# Patient Record
Sex: Female | Born: 1980 | Race: White | Hispanic: No | Marital: Married | Smoking: Never smoker
Health system: Southern US, Community
[De-identification: ages and names within clinical notes are randomized; demographics above are authoritative.]

## PROBLEM LIST (undated history)

## (undated) ENCOUNTER — Inpatient Hospital Stay (HOSPITAL_COMMUNITY): Payer: Self-pay

## (undated) DIAGNOSIS — E559 Vitamin D deficiency, unspecified: Secondary | ICD-10-CM

## (undated) DIAGNOSIS — J309 Allergic rhinitis, unspecified: Secondary | ICD-10-CM

## (undated) DIAGNOSIS — J069 Acute upper respiratory infection, unspecified: Secondary | ICD-10-CM

## (undated) DIAGNOSIS — R21 Rash and other nonspecific skin eruption: Secondary | ICD-10-CM

## (undated) HISTORY — DX: Rash and other nonspecific skin eruption: R21

## (undated) HISTORY — DX: Vitamin D deficiency, unspecified: E55.9

## (undated) HISTORY — PX: OTHER SURGICAL HISTORY: SHX169

## (undated) HISTORY — PX: ECTOPIC PREGNANCY SURGERY: SHX613

## (undated) HISTORY — DX: Allergic rhinitis, unspecified: J30.9

## (undated) HISTORY — DX: Acute upper respiratory infection, unspecified: J06.9

---

## 2003-12-23 ENCOUNTER — Ambulatory Visit: Payer: Self-pay | Admitting: Internal Medicine

## 2005-12-13 ENCOUNTER — Ambulatory Visit: Payer: Self-pay | Admitting: Internal Medicine

## 2005-12-13 ENCOUNTER — Ambulatory Visit (HOSPITAL_COMMUNITY): Admission: RE | Admit: 2005-12-13 | Discharge: 2005-12-13 | Payer: Self-pay | Admitting: Internal Medicine

## 2007-02-19 ENCOUNTER — Ambulatory Visit: Payer: Self-pay | Admitting: Internal Medicine

## 2007-02-22 ENCOUNTER — Telehealth: Payer: Self-pay | Admitting: Internal Medicine

## 2007-02-26 ENCOUNTER — Ambulatory Visit: Payer: Self-pay | Admitting: Internal Medicine

## 2007-03-26 ENCOUNTER — Ambulatory Visit: Payer: Self-pay | Admitting: Internal Medicine

## 2007-07-22 ENCOUNTER — Telehealth (INDEPENDENT_AMBULATORY_CARE_PROVIDER_SITE_OTHER): Payer: Self-pay | Admitting: *Deleted

## 2007-08-02 ENCOUNTER — Ambulatory Visit (HOSPITAL_COMMUNITY): Admission: RE | Admit: 2007-08-02 | Discharge: 2007-08-02 | Payer: Self-pay | Admitting: Obstetrics and Gynecology

## 2007-08-22 ENCOUNTER — Ambulatory Visit: Payer: Self-pay | Admitting: Internal Medicine

## 2007-08-22 DIAGNOSIS — J069 Acute upper respiratory infection, unspecified: Secondary | ICD-10-CM | POA: Insufficient documentation

## 2007-08-22 HISTORY — DX: Acute upper respiratory infection, unspecified: J06.9

## 2009-09-27 ENCOUNTER — Ambulatory Visit: Payer: Self-pay | Admitting: Internal Medicine

## 2009-09-27 LAB — CONVERTED CEMR LAB
BUN: 15 mg/dL (ref 6–23)
Basophils Absolute: 0 10*3/uL (ref 0.0–0.1)
Bilirubin, Direct: 0.2 mg/dL (ref 0.0–0.3)
Cholesterol: 168 mg/dL (ref 0–200)
Creatinine, Ser: 0.7 mg/dL (ref 0.4–1.2)
Eosinophils Relative: 1.4 % (ref 0.0–5.0)
GFR calc non Af Amer: 101.56 mL/min (ref >60)
Glucose, Bld: 76 mg/dL (ref 70–99)
HCT: 40.3 % (ref 36.0–46.0)
LDL Cholesterol: 116 mg/dL — ABNORMAL HIGH (ref 0–99)
Lymphs Abs: 1.5 10*3/uL (ref 0.7–4.0)
MCV: 90.2 fL (ref 78.0–100.0)
Monocytes Absolute: 0.4 10*3/uL (ref 0.1–1.0)
Monocytes Relative: 8.1 % (ref 3.0–12.0)
Neutrophils Relative %: 60.1 % (ref 43.0–77.0)
Nitrite: NEGATIVE
Platelets: 228 10*3/uL (ref 150.0–400.0)
Potassium: 5.7 meq/L — ABNORMAL HIGH (ref 3.5–5.1)
RDW: 12.7 % (ref 11.5–14.6)
Specific Gravity, Urine: >=1.03 (ref 1.000–1.030)
TSH: 1.13 microintl units/mL (ref 0.35–5.50)
Total Bilirubin: 0.8 mg/dL (ref 0.3–1.2)
Total Protein, Urine: NEGATIVE mg/dL
Triglycerides: 27 mg/dL (ref 0.0–149.0)
VLDL: 5.4 mg/dL (ref 0.0–40.0)
WBC: 4.9 10*3/uL (ref 4.5–10.5)
pH: 5 (ref 5.0–8.0)

## 2009-10-01 ENCOUNTER — Ambulatory Visit: Payer: Self-pay | Admitting: Internal Medicine

## 2009-10-01 DIAGNOSIS — R21 Rash and other nonspecific skin eruption: Secondary | ICD-10-CM

## 2009-10-01 HISTORY — DX: Rash and other nonspecific skin eruption: R21

## 2010-02-15 NOTE — Assessment & Plan Note (Signed)
Summary: PER ROBIN WORK IN-PHYSICAL W/HEP A #2 HEP B # 3--STC   Vital Signs:  Patient profile:   30 year old female Height:      64 inches Weight:      139 pounds BMI:     23.95 O2 Sat:      98 % on Room air Temp:     96.9 degrees F oral Pulse rate:   68 / minute BP sitting:   90 / 60  (left arm) Cuff size:   regular  Vitals Entered By: Zella Ball Ewing CMA Duncan Dull) (October 01, 2009 2:48 PM)  O2 Flow:  Room air  CC: Adult Physical/RE   CC:  Adult Physical/RE.  History of Present Illness: here with husbnad and kids for wellness; overall doing well, no specific complaints except for a small area to the left shoudler itchy slihgt scaly rash for several months without pain or tenderness or worsening;  Pt denies CP, worsening sob, doe, wheezing, orthopnea, pnd, worsening LE edema, palps, dizziness or syncope  Pt denies new neuro symptoms such as headache, facial or extremity weakness  No fever, wt loss, night sweats, loss of appetite or other constitutional symptoms  Did incidendlyt have mild dysuria and freq last wk when her urine specimen was obtained, adn has resolved with the cipro to which she tolerated well.    Preventive Screening-Counseling & Management      Drug Use:  no.    Problems Prior to Update: 1)  Uri  (ICD-465.9)  Medications Prior to Update: 1)  Amoxil 500 Mg  Caps (Amoxicillin) .... 2 By Mouth Two Times A Day 2)  Cipro 500 Mg Tabs (Ciprofloxacin Hcl) .Marland Kitchen.. 1 By Mouth Two Times A Day For 7 Days  Current Medications (verified): 1)  Azithromycin 250 Mg Tabs (Azithromycin) .... 2po Qd For 1 Day, Then 1po Qd For 4days, Then Stop 2)  Cipro 500 Mg Tabs (Ciprofloxacin Hcl) .Marland Kitchen.. 1 By Mouth Two Times A Day For 7 Days 3)  Lotrisone 1-0.05 % Crea (Clotrimazole-Betamethasone) .... 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 neg per pt  Social History: Reviewed history from 02/19/2007 and no changes required. 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         all otherwise negative per pt -    Physical Exam  General:  alert and well-developed.   Head:  Normocephalic and atraumatic without obvious abnormalities. No apparent alopecia or balding. Eyes:  No corneal or conjunctival inflammation noted. EOMI. Perrla. Ears:  External ear exam shows no significant lesions or deformities.  Otoscopic examination reveals clear canals, tympanic membranes are intact bilaterally without bulging, retraction,  inflammation or discharge. Hearing is grossly normal bilaterally. Nose:  no external deformity and no nasal discharge.   Mouth:  good dentition and pharyngeal erythema.   Neck:  supple and no masses.   Lungs:  Normal respiratory effort, chest expands symmetrically. Lungs are clear to auscultation, no crackles or wheezes. Heart:  Normal rate and regular rhythm. S1 and S2 normal without gallop, murmur, click, rub or other  extra sounds. Abdomen:  soft, non-tender, and normal bowel sounds.   Msk:  no joint tenderness and no joint swelling.   Extremities:  no edema, no erythema  Neurologic:  cranial nerves II-XII intact and strength normal in all extremities.   Skin:  color normal.  except for 1 cm area to left trapezoid areea flat scaly rash, nontender Psych:  not anxious appearing and not depressed appearing.     Impression & Recommendations:  Problem # 1:  Preventive Health Care (ICD-V70.0) Assessment New Overall doing well, age appropriate education and counseling updated and referral for appropriate preventive services done unless declined, immunizations up to date or declined, diet counseling done if overweight, urged to quit smoking if smokes , most recent labs reviewed and current ordered if appropriate, ecg reviewed or declined (interpretation per ECG scanned in the EMR if done); information regarding Medicare Prevention requirements given if appropriate; speciality referrals updated as appropriate   Problem # 2:  RASH-NONVESICULAR (ICD-782.1)  Her updated medication list for this problem includes:    Lotrisone 1-0.05 % Crea (Clotrimazole-betamethasone) ..... Use asd two times a day as needed to affected area  treat as above, f/u any worsening signs or symptoms   Complete Medication List: 1)  Azithromycin 250 Mg Tabs (Azithromycin) .... 2po qd for 1 day, then 1po qd for 4days, then stop 2)  Cipro 500 Mg Tabs (Ciprofloxacin hcl) .Marland Kitchen.. 1 by mouth two times a day for 7 days 3)  Lotrisone 1-0.05 % Crea (Clotrimazole-betamethasone) .... Use asd two times a day as needed to affected area  Other Orders: Admin 1st Vaccine (16109) Flu Vaccine 34yrs + (60454) TwinRix 1ml ( Hep A&B Adult dose) (09811) Admin of Any Addtl Vaccine (91478)  Patient Instructions: 1)  you had the flu shot today 2)  you had the HepA/HepB shot today 9called twinrx) 3)  Please take all new medications as prescribed  - the zpack  antibx if you are sick in [place], and the cream for the small rash on the shoulder 4)  Continue all previous medications as before this visit  - to finish the antibiotic for the bladder infection 5)  please follow lower cholesterol diet, get regular excercise 6)  Please schedule a follow-up appointment in 1 year or as needed Prescriptions: LOTRISONE 1-0.05 % CREA (CLOTRIMAZOLE-BETAMETHASONE) use asd two times a day as needed to affected area  #1 x 1   Entered and Authorized by:   Corwin Levins MD   Signed by:   Corwin Levins MD on 10/01/2009   Method used:   Print then Give to Patient   RxID:   [id number] AZITHROMYCIN 250 MG TABS (AZITHROMYCIN) 2po qd for 1 day, then 1po qd for 4days, then stop  #6 x 1   Entered and Authorized by:   Corwin Levins MD   Signed by:   Corwin Levins MD on 10/01/2009   Method used:   Print then Give to Patient   RxID:   [id number]     Flu Vaccine Consent Questions     Do  you have a history of severe allergic reactions to this vaccine? no    Any prior history of allergic reactions to egg and/or gelatin? no    Do you have a sensitivity to the preservative Thimersol? no    Do you have a past history of Guillan-Barre Syndrome? no    Do you currently have an acute febrile illness? no    Have you ever had a severe reaction to latex? no    Vaccine information given and explained to patient? yes    Are you currently pregnant? no    Lot Number:[id number]   Exp Date:07/16/2010   Site Given  Left Deltoid IMlu   Immunizations Administered:  TwinRix # 1:    Vaccine Type: TwinRix    Site: right deltoid    Mfr: GlaxoSmithKline    Dose: 0.5 ml    Route: IM    Given by: Zella Ball Ewing CMA (AAMA)    Exp. Date: 10/23/2011    Lot #: [id number]    VIS given: 10/04/06 version given October 01, 2009.   Immunizations Administered:  TwinRix # 1:    Vaccine Type: TwinRix    Site: right deltoid    Mfr: GlaxoSmithKline    Dose: 0.5 ml    Route: IM    Given  by: Zella Ball Ewing CMA (AAMA)    Exp. Date: 10/23/2011    Lot #: [id number]    VIS given: 10/04/06 version given October 01, 2009.

## 2010-05-23 ENCOUNTER — Encounter: Payer: Self-pay | Admitting: Internal Medicine

## 2010-05-23 DIAGNOSIS — Z0000 Encounter for general adult medical examination without abnormal findings: Secondary | ICD-10-CM | POA: Insufficient documentation

## 2010-05-24 ENCOUNTER — Ambulatory Visit (INDEPENDENT_AMBULATORY_CARE_PROVIDER_SITE_OTHER): Payer: BC Managed Care – PPO | Admitting: Internal Medicine

## 2010-05-24 ENCOUNTER — Encounter: Payer: Self-pay | Admitting: Internal Medicine

## 2010-05-24 ENCOUNTER — Telehealth: Payer: Self-pay | Admitting: Internal Medicine

## 2010-05-24 DIAGNOSIS — R062 Wheezing: Secondary | ICD-10-CM

## 2010-05-24 DIAGNOSIS — J209 Acute bronchitis, unspecified: Secondary | ICD-10-CM

## 2010-05-24 DIAGNOSIS — J309 Allergic rhinitis, unspecified: Secondary | ICD-10-CM | POA: Insufficient documentation

## 2010-05-24 MED ORDER — CEPHALEXIN 500 MG PO CAPS: 500.0000 mg | ORAL_CAPSULE | Freq: Four times a day (QID) | ORAL | Status: AC

## 2010-05-24 MED ORDER — PREDNISONE 10 MG PO TABS: 10.0000 mg | ORAL_TABLET | Freq: Every day | ORAL | Status: AC

## 2010-05-24 MED ORDER — METHYLPREDNISOLONE ACETATE 80 MG/ML IJ SUSP
120.0000 mg | Freq: Once | INTRAMUSCULAR | Status: AC
  Administered 2010-05-24: 120 mg via INTRAMUSCULAR

## 2010-05-24 MED ORDER — FLUTICASONE PROPIONATE 50 MCG/ACT NA SUSP: 2.0000 | Freq: Every day | NASAL | Status: DC

## 2010-05-24 MED ORDER — HYDROCODONE-HOMATROPINE 5-1.5 MG/5ML PO SYRP: 5.0000 mL | ORAL_SOLUTION | Freq: Four times a day (QID) | ORAL | Status: AC | PRN

## 2010-05-24 NOTE — Telephone Encounter (Signed)
Pt's husband, Urbano Heir, returned called.  States pt has lost her voice and cannot talk right now.  States pt was seen by Dr. [name] today for breathing problems.  This has been going on for a few weeks.  Pt has a pending appt with [facility] for 07/04/10 for allergy consult but pt's husband thinks this is too far away for pt to wait if her breathing problems are coming from allergies.  He would like her to be seen sooner.  Dr. Maple Hudson, pls advise if pt can be worked in sooner.  Thanks!

## 2010-05-24 NOTE — Assessment & Plan Note (Signed)
Mild, likely due to above, does not ned inhaler but will tx with depomedrol IM today, and prednisone low dose for home

## 2010-05-24 NOTE — Telephone Encounter (Signed)
Pt has pending allergy consult with [facility] for 07/04/10 - no openings before then.  LMOMTCB to see what problems pt is having then will send message to [facility].

## 2010-05-24 NOTE — Assessment & Plan Note (Signed)
uncontroled for 1-2 mo prior to onset symtpoms,  Will add flonase to her allegra D OTC, and refer to allergy per pt request

## 2010-05-24 NOTE — Assessment & Plan Note (Signed)
Mild, for antibx  Course, cough med prn,  to f/u any worsening symptoms or concerns

## 2010-05-24 NOTE — Patient Instructions (Signed)
You had the steroid shot today Take all new medications as prescribed Continue all other medications as before You will be contacted regarding the referral for: allergy

## 2010-05-25 NOTE — Telephone Encounter (Signed)
Spoke w/ Urbano Heir pt husband and he is aware of apt date, time, location. Also aware to arrive 15 mins early to fill out paper work  He is also aware pt to bring insurance card and updated med list or all pt medications. Nothing further was needed. Pt coming in 5/23 at 11:15

## 2010-05-25 NOTE — Telephone Encounter (Signed)
We can work patient in on Wednesday 06-08-10 at 11am for 1115am appt-will need to bring insurance card,copays, and updated medication list.

## 2010-05-29 ENCOUNTER — Encounter: Payer: Self-pay | Admitting: Internal Medicine

## 2010-05-29 NOTE — Progress Notes (Signed)
  Subjective:    Patient ID: Regina Fox, female    DOB: [birth year], 30 y.o.   MRN: [id number]  HPI Here with acute onset mild to mod 2-3 days ST, HA, general weakness and malaise, with prod cough greenish sputum, but Pt denies chest pain,  orthopnea, PND, increased LE swelling, palpitations, dizziness or syncope, but has had mild wheezing,sob as well. Does have several wks ongoing nasal allergy symptoms with clear congestion, itch and sneeze, without fever, pain, ST, cough or wheezing.  Pt denies new neurological symptoms such as new headache, or facial or extremity weakness or numbness  Pt denies polydipsia, polyuria. Past Medical History  Diagnosis Date  . URI 08/22/2007  . RASH-NONVESICULAR 10/01/2009   No past surgical history on file.  reports that she has never smoked. She does not have any smokeless tobacco history on file. She reports that she does not drink alcohol or use illicit drugs. family history is not on file. No Known Allergies Current Outpatient Prescriptions on File Prior to Visit  Medication Sig Dispense Refill  . azithromycin (ZITHROMAX) 250 MG tablet Take 2 tablets by mouth on day 1, followed by 1 tablet by mouth daily for 4 days. 2 by mouth for 1 day, then 1 by mouth for 4 days, then stop       . ciprofloxacin (CIPRO) 500 MG tablet Take 500 mg by mouth 2 (two) times daily. For 7 days then stop.       . clotrimazole-betamethasone (LOTRISONE) cream Apply topically 2 (two) times daily.         Review of Systems All otherwise neg per pt     Objective:   Physical Exam BP 98/60  Pulse 84  Temp(Src) 97.9 F (36.6 C) (Oral)  Ht 5\' 2"  (1.575 m)  Wt 138 lb 12 oz (62.937 kg)  BMI 25.38 kg/m2  SpO2 96%  LMP 04/30/2010 Physical Exam  VS noted Constitutional: Pt appears well-developed and well-nourished.  HENT: Head: Normocephalic.  Right Ear: External ear normal.  Left Ear: External ear normal.  Bilat tm's mild erythema.  Sinus nontender.  Pharynx mild erythema Eyes:  Conjunctivae and EOM are normal. Pupils are equal, round, and reactive to light.  Neck: Normal range of motion. Neck supple.  Cardiovascular: Normal rate and regular rhythm.   Pulmonary/Chest: Effort normal and breath sounds decreased bilat with mild wheeze.  Neurological: Pt is alert. No cranial nerve deficit.  Skin: Skin is warm. No erythema.  Psychiatric: Pt behavior is normal. Thought content normal.        Assessment & Plan:

## 2010-06-08 ENCOUNTER — Institutional Professional Consult (permissible substitution): Payer: BC Managed Care – PPO | Admitting: Internal Medicine

## 2010-07-04 ENCOUNTER — Institutional Professional Consult (permissible substitution): Payer: BC Managed Care – PPO | Admitting: Internal Medicine

## 2010-07-18 ENCOUNTER — Ambulatory Visit (INDEPENDENT_AMBULATORY_CARE_PROVIDER_SITE_OTHER): Payer: BC Managed Care – PPO | Admitting: Internal Medicine

## 2010-07-18 ENCOUNTER — Other Ambulatory Visit: Payer: BC Managed Care – PPO

## 2010-07-18 ENCOUNTER — Encounter: Payer: Self-pay | Admitting: *Deleted

## 2010-07-18 ENCOUNTER — Telehealth: Payer: Self-pay | Admitting: *Deleted

## 2010-07-18 VITALS — BP 110/68 | HR 84 | Temp 97.8°F | Resp 14 | Wt 140.1 lb

## 2010-07-18 DIAGNOSIS — R51 Headache: Secondary | ICD-10-CM | POA: Insufficient documentation

## 2010-07-18 DIAGNOSIS — N643 Galactorrhea not associated with childbirth: Secondary | ICD-10-CM | POA: Insufficient documentation

## 2010-07-18 DIAGNOSIS — R519 Headache, unspecified: Secondary | ICD-10-CM | POA: Insufficient documentation

## 2010-07-18 DIAGNOSIS — J309 Allergic rhinitis, unspecified: Secondary | ICD-10-CM

## 2010-07-18 DIAGNOSIS — J069 Acute upper respiratory infection, unspecified: Secondary | ICD-10-CM

## 2010-07-18 HISTORY — DX: Allergic rhinitis, unspecified: J30.9

## 2010-07-18 MED ORDER — ISOMETHEPTENE-APAP-DICHLORAL 65-325-100 MG PO CAPS: 1.0000 | ORAL_CAPSULE | ORAL | Status: DC | PRN

## 2010-07-18 MED ORDER — AZITHROMYCIN 250 MG PO TABS: ORAL_TABLET | ORAL | Status: AC

## 2010-07-18 MED ORDER — NAPROXEN 500 MG PO TABS: 500.0000 mg | ORAL_TABLET | Freq: Two times a day (BID) | ORAL | Status: DC

## 2010-07-18 NOTE — Telephone Encounter (Signed)
Same Day Abstraction. 

## 2010-07-18 NOTE — Progress Notes (Signed)
  Subjective:    Patient ID: Regina Fox, female    DOB: Apr 13, 1980, 30 y.o.   MRN: [id number]  HPI  Here with c/o daily HA for 4 wks, mild to mod, pressure liek, sometimes throbbing, without blurred vision, n/v, dizziness and Pt denies new neurological symptoms such as facial or extremity weakness or numbness.  Ibuprofen no help. Stress makes worse, has 2 small children at home, considering having a third.  Husband has taken time off work for last 4 days, but HA's persist.  No pain at night, but seems to wake up with HA mild, then worse by the end of the day. No hypersomia, trauma, and Denies worsening depressive symptoms, suicidal ideation, or panic.  + fatigue . Did also see GYN recently with galactorrhea  - per husband told to tx with ibuprofen and no other plan for eval or tx.  PT speaks some english but primary langauge is arabic/husband translates.  Originally from [place]. Does have several wks ongoing nasal allergy symptoms with clear congestion, itch and sneeze, without fever, pain, ST, cough or wheezing.  Also incidentally with mild scratchy throat in the past 2 days now better Past Medical History  Diagnosis Date  . URI 08/22/2007  . RASH-NONVESICULAR 10/01/2009  . Allergic rhinitis, cause unspecified 07/18/2010   Past Surgical History  Procedure Date  . Denied surgical hx     reports that she has never smoked. She does not have any smokeless tobacco history on file. She reports that she does not drink alcohol or use illicit drugs. family history is not on file. No Known Allergies No current outpatient prescriptions on file prior to visit.   Review of Systems Review of Systems  Constitutional: Negative for diaphoresis and unexpected weight change.  HENT: Negative for drooling and tinnitus.   Eyes: Negative for photophobia and visual disturbance.  Respiratory: Negative for choking and stridor.   Musculoskeletal: Negative for gait problem.  Skin: Negative for color change and wound.    Neurological: Negative for tremors and numbness.  Psychiatric/Behavioral: Negative for decreased concentration. The patient is not hyperactive.       Objective:   Physical Exam BP 110/68  Pulse 84  Temp(Src) 97.8 F (36.6 C) (Oral)  Resp 14  Wt 140 lb 2 oz (63.56 kg)  SpO2 98%  LMP 05/24/2010 Physical Exam  VS noted Constitutional: Pt appears well-developed and well-nourished.  HENT: Head: Normocephalic.  Right Ear: External ear normal.  Left Ear: External ear normal.  Bilat tm's mild erythema.  Sinus nontender.  Pharynx mild erythema Eyes: Conjunctivae and EOM are normal. Pupils are equal, round, and reactive to light.  Neck: Normal range of motion. Neck supple.  Cardiovascular: Normal rate and regular rhythm.   Pulmonary/Chest: Effort normal and breath sounds normal.  Abd:  Soft, NT, non-distended, + BS Neurological: Pt is alert. No cranial nerve deficit. motor/sens/dtr intact Skin: Skin is warm. No erythema.  Psychiatric: Pt behavior is normal. Thought content normal. 1+ nervous, not depressed appearing        Assessment & Plan:

## 2010-07-18 NOTE — Patient Instructions (Addendum)
Please stop the ibuprofen Start the naproxen as prescribed for pain as needed only You can also try OTC exedrin migraine for milder HA's Take all new medications as prescribed - the generic for Midrin for more severe HA's, and the antibiotic You can also take Delsym OTC for cough, and/or Mucinex (or it's generic off brand) for congestion You will be contacted regarding the referral for: MRI for the head, and [facility] Please go to LAB in the Basement for the blood and/or urine tests to be done today - the prolactin level Please call the phone number [phone] (the [employer]Tree System) for results of testing in 2-3 days;  When calling, simply dial the number, and when prompted enter the MRN number above (the Medical Record Number) and the # key, then the message should start.

## 2010-07-19 NOTE — Progress Notes (Signed)
Quick Note:  Voice message left on PhoneTree system - lab is negative, normal or otherwise stable, pt to continue same tx ______ 

## 2010-07-20 ENCOUNTER — Encounter: Payer: Self-pay | Admitting: Internal Medicine

## 2010-07-20 NOTE — Assessment & Plan Note (Signed)
Incidental, for mucinex otc prn, zpack x 1,  to f/u any worsening symptoms or concerns

## 2010-07-20 NOTE — Assessment & Plan Note (Signed)
Mild to mod, for allegra prn, declines further flonase,   to f/u any worsening symptoms or concerns, consdier allergy referral

## 2010-07-20 NOTE — Assessment & Plan Note (Signed)
Suspect mixed HA  - tension/migraine;  For naproxen prn/excedrin migraine for milder HA, gave midrin for more severe, consider imitrex, and with galactorrhea will need Head MRI, as well as HA wellness referral for dialy HA

## 2010-07-20 NOTE — Assessment & Plan Note (Signed)
Mild persistent - for prolactin level, and Head MRI as above;  Consider endo referral

## 2010-08-01 ENCOUNTER — Other Ambulatory Visit: Payer: Self-pay | Admitting: Internal Medicine

## 2010-08-01 DIAGNOSIS — R51 Headache: Secondary | ICD-10-CM

## 2010-08-02 ENCOUNTER — Ambulatory Visit
Admission: RE | Admit: 2010-08-02 | Discharge: 2010-08-02 | Disposition: A | Discharge status: Home / Self Care | Payer: BC Managed Care – PPO | Source: Ambulatory Visit | Attending: Internal Medicine | Admitting: Internal Medicine

## 2010-08-02 MED ORDER — GADOBENATE DIMEGLUMINE 529 MG/ML IV SOLN
7.0000 mL | Freq: Once | INTRAVENOUS | Status: AC | PRN
  Administered 2010-08-02: 7 mL via INTRAVENOUS

## 2010-08-03 NOTE — Progress Notes (Signed)
Quick Note:  Voice message left on PhoneTree system - lab is negative, normal or otherwise stable, pt to continue same tx ______ 

## 2010-08-04 ENCOUNTER — Telehealth: Payer: Self-pay | Admitting: *Deleted

## 2010-08-04 NOTE — Telephone Encounter (Signed)
I HA's persist, I can refer to [facility] - is this OK?

## 2010-08-04 NOTE — Telephone Encounter (Signed)
Pt informed/MRI. Inquiring as to next step to determine etiology of headaches.

## 2010-08-05 NOTE — Telephone Encounter (Signed)
Called pt husband no answer LMOM RTC.Marland KitchenMarland Kitchen7/20/12@3 :10pm/LMB

## 2010-08-08 NOTE — Telephone Encounter (Signed)
Patient and husband are informed and have referral to [facility] wellness ctr.

## 2010-10-05 ENCOUNTER — Telehealth: Payer: Self-pay

## 2010-10-05 DIAGNOSIS — Z0000 Encounter for general adult medical examination without abnormal findings: Secondary | ICD-10-CM

## 2010-10-05 NOTE — Telephone Encounter (Signed)
Put order in for physical labs. 

## 2010-11-07 ENCOUNTER — Other Ambulatory Visit (INDEPENDENT_AMBULATORY_CARE_PROVIDER_SITE_OTHER): Payer: BC Managed Care – PPO

## 2010-11-07 DIAGNOSIS — Z0000 Encounter for general adult medical examination without abnormal findings: Secondary | ICD-10-CM

## 2010-11-07 LAB — BASIC METABOLIC PANEL
Calcium: 8.9 mg/dL (ref 8.4–10.5)
GFR: 121.63 mL/min (ref >60.00)
Glucose, Bld: 86 mg/dL (ref 70–99)
Sodium: 139 mEq/L (ref 135–145)

## 2010-11-07 LAB — TSH: TSH: 1.22 u[IU]/mL (ref 0.35–5.50)

## 2010-11-07 LAB — CBC WITH DIFFERENTIAL/PLATELET
Eosinophils Absolute: 0 10*3/uL (ref 0.0–0.7)
Eosinophils Relative: 0.6 % (ref 0.0–5.0)
HCT: 40 % (ref 36.0–46.0)
Lymphs Abs: 1.5 10*3/uL (ref 0.7–4.0)
MCHC: 34.1 g/dL (ref 30.0–36.0)
MCV: 89.3 fl (ref 78.0–100.0)
Monocytes Absolute: 0.4 10*3/uL (ref 0.1–1.0)
Neutrophils Relative %: 64.3 % (ref 43.0–77.0)
Platelets: 208 10*3/uL (ref 150.0–400.0)
RDW: 12.4 % (ref 11.5–14.6)

## 2010-11-07 LAB — URINALYSIS, ROUTINE W REFLEX MICROSCOPIC
Specific Gravity, Urine: 1.025 (ref 1.000–1.030)
Urine Glucose: NEGATIVE
Urobilinogen, UA: 0.2 (ref 0.0–1.0)
pH: 6 (ref 5.0–8.0)

## 2010-11-07 LAB — LIPID PANEL
LDL Cholesterol: 93 mg/dL (ref 0–99)
VLDL: 5 mg/dL (ref 0.0–40.0)

## 2010-11-07 LAB — HEPATIC FUNCTION PANEL
Albumin: 4.3 g/dL (ref 3.5–5.2)
Alkaline Phosphatase: 47 U/L (ref 39–117)
Total Bilirubin: 0.9 mg/dL (ref 0.3–1.2)

## 2010-11-09 ENCOUNTER — Encounter: Payer: Self-pay | Admitting: Internal Medicine

## 2010-11-09 ENCOUNTER — Ambulatory Visit (INDEPENDENT_AMBULATORY_CARE_PROVIDER_SITE_OTHER): Payer: BC Managed Care – PPO | Admitting: Internal Medicine

## 2010-11-09 VITALS — BP 98/64 | HR 70 | Temp 98.0°F | Ht 61.0 in | Wt 146.5 lb

## 2010-11-09 DIAGNOSIS — Z0000 Encounter for general adult medical examination without abnormal findings: Secondary | ICD-10-CM

## 2010-11-09 NOTE — Patient Instructions (Signed)
Continue all other medications as before - the OTC we discussed Please return in 1 year for your yearly visit, or sooner if needed, with Lab testing done 3-5 days before

## 2010-11-13 ENCOUNTER — Encounter: Payer: Self-pay | Admitting: Internal Medicine

## 2010-11-13 NOTE — Progress Notes (Signed)
Subjective:    Patient ID: Regina Fox, female    DOB: [birth year], 30 y.o.   MRN: [id number]  HPI  Here for wellness and f/u;  Overall doing ok;  Pt denies CP, worsening SOB, DOE, wheezing, orthopnea, PND, worsening LE edema, palpitations, dizziness or syncope.  Pt denies neurological change such as new Headache, facial or extremity weakness.  Pt denies polydipsia, polyuria, or low sugar symptoms. Pt states overall good compliance with treatment and medications, good tolerability, and trying to follow lower cholesterol diet.  Pt denies worsening depressive symptoms, suicidal ideation or panic. No fever, wt loss, night sweats, loss of appetite, or other constitutional symptoms.  Pt states good ability with ADL's, low fall risk, home safety reviewed and adequate, no significant changes in hearing or vision, and occasionally active with exercise.  No acute complaints today Past Medical History  Diagnosis Date  . URI 08/22/2007  . RASH-NONVESICULAR 10/01/2009  . Allergic rhinitis, cause unspecified 07/18/2010   Past Surgical History  Procedure Date  . Denied surgical hx     reports that she has never smoked. She does not have any smokeless tobacco history on file. She reports that she does not drink alcohol or use illicit drugs. family history is not on file. No Known Allergies Current Outpatient Prescriptions on File Prior to Visit  Medication Sig Dispense Refill  . isometheptene-acetaminophen-dichloralphenazone (MIDRIN) 65-325-100 MG capsule Take 1 capsule by mouth every 4 (four) hours as needed.  30 capsule  2  . naproxen (NAPROSYN) 500 MG tablet Take 1 tablet (500 mg total) by mouth 2 (two) times daily with a meal.  60 tablet  2   Review of Systems Review of Systems  Constitutional: Negative for diaphoresis, activity change, appetite change and unexpected weight change.  HENT: Negative for hearing loss, ear pain, facial swelling, mouth sores and neck stiffness.   Eyes: Negative for pain,  redness and visual disturbance.  Respiratory: Negative for shortness of breath and wheezing.   Cardiovascular: Negative for chest pain and palpitations.  Gastrointestinal: Negative for diarrhea, blood in stool, abdominal distention and rectal pain.  Genitourinary: Negative for hematuria, flank pain and decreased urine volume.  Musculoskeletal: Negative for myalgias and joint swelling.  Skin: Negative for color change and wound.  Neurological: Negative for syncope and numbness.  Hematological: Negative for adenopathy.  Psychiatric/Behavioral: Negative for hallucinations, self-injury, decreased concentration and agitation.      Objective:   Physical Exam BP 98/64  Pulse 70  Temp(Src) 98 F (36.7 C) (Oral)  Ht 5\' 1"  (1.549 m)  Wt 146 lb 8 oz (66.452 kg)  BMI 27.68 kg/m2  SpO2 95%  LMP 10/13/2010 Physical Exam  VS noted Constitutional: Pt is oriented to person, place, and time. Appears well-developed and well-nourished.  HENT:  Head: Normocephalic and atraumatic.  Right Ear: External ear normal.  Left Ear: External ear normal.  Nose: Nose normal.  Mouth/Throat: Oropharynx is clear and moist.  Eyes: Conjunctivae and EOM are normal. Pupils are equal, round, and reactive to light.  Neck: Normal range of motion. Neck supple. No JVD present. No tracheal deviation present.  Cardiovascular: Normal rate, regular rhythm, normal heart sounds and intact distal pulses.   Pulmonary/Chest: Effort normal and breath sounds normal.  Abdominal: Soft. Bowel sounds are normal. There is no tenderness.  Musculoskeletal: Normal range of motion. Exhibits no edema.  Lymphadenopathy:  Has no cervical adenopathy.  Neurological: Pt is alert and oriented to person, place, and time. Pt has normal reflexes. No cranial  nerve deficit.  Skin: Skin is warm and dry. No rash noted.  Psychiatric:  Has  normal mood and affect. Behavior is normal.     Assessment & Plan:

## 2010-11-13 NOTE — Assessment & Plan Note (Signed)

## 2010-11-17 ENCOUNTER — Ambulatory Visit (INDEPENDENT_AMBULATORY_CARE_PROVIDER_SITE_OTHER): Payer: BC Managed Care – PPO | Admitting: Internal Medicine

## 2010-11-17 ENCOUNTER — Encounter: Payer: Self-pay | Admitting: Internal Medicine

## 2010-11-17 ENCOUNTER — Ambulatory Visit (INDEPENDENT_AMBULATORY_CARE_PROVIDER_SITE_OTHER)
Admission: RE | Admit: 2010-11-17 | Discharge: 2010-11-17 | Disposition: A | Discharge status: Home / Self Care | Payer: BC Managed Care – PPO | Source: Ambulatory Visit | Attending: Internal Medicine | Admitting: Internal Medicine

## 2010-11-17 VITALS — BP 114/68 | HR 72 | Temp 98.2°F | Resp 16 | Wt 150.0 lb

## 2010-11-17 DIAGNOSIS — S6992XA Unspecified injury of left wrist, hand and finger(s), initial encounter: Secondary | ICD-10-CM

## 2010-11-17 DIAGNOSIS — S6990XA Unspecified injury of unspecified wrist, hand and finger(s), initial encounter: Secondary | ICD-10-CM

## 2010-11-17 NOTE — Progress Notes (Signed)
  Subjective:    Patient ID: Regina Fox, female    DOB: [birth year], 30 y.o.   MRN: [id number]  HPI  New to me she tells me that she fell last night and hit her left hand on the ground, today she complains of pain/swelling/bruising in the left hand but she still has good ROM with no paresthesias, she does not want any meds for pain.  Review of Systems  Constitutional: Negative.   HENT: Negative.   Eyes: Negative.   Respiratory: Negative.   Cardiovascular: Negative.   Gastrointestinal: Negative.   Genitourinary: Negative.   Musculoskeletal: Positive for arthralgias (left hand). Negative for myalgias, back pain, joint swelling and gait problem.  Skin: Negative for color change, pallor, rash and wound.  Neurological: Negative for dizziness, tremors, seizures, syncope, facial asymmetry, speech difficulty, weakness, light-headedness, numbness and headaches.  Hematological: Negative.   Psychiatric/Behavioral: Negative.        Objective:   Physical Exam  Musculoskeletal: Normal range of motion.       Left hand: She exhibits tenderness and swelling. She exhibits normal range of motion, no bony tenderness, normal capillary refill, no deformity and no laceration. normal sensation noted. Normal strength noted.       Hands:         Assessment & Plan:

## 2010-11-17 NOTE — Assessment & Plan Note (Signed)
I will xray her hand to look for fracture, she will use otc meds as needed for pain, I gave her pt ed material about the treatment of hand injuries

## 2010-11-17 NOTE — Patient Instructions (Signed)
Hand Injuries Minor fractures, sprains, bruises and burns of the hand are often managed in much the same way:  Elevation of your hand above the level of your heart for a few days after the injury, until the pain and swelling improve.   The use of hand dressings and splints to reduce motion, relieve painand prevent reinjury. The dressing and splint should not be removed without the caregiver's approval.   Application of ice packs for 20 to 30 minutes every few hours for 2 to 3 days to reduce pain and swelling due to fractures, sprains, and deep bruises.   The use of medicine to reduce pain and inflammation.  Early motion exercises are sometimes needed to reduce joint stiffness after a hand injury. However, your hand should not be used for any activities that increase pain. Document Released: 02/10/2004 Document Revised: 09/14/2010 Document Reviewed: 04/03/2008 [facility] Patient Information 2012 [place], [place].

## 2010-12-01 ENCOUNTER — Ambulatory Visit: Payer: BC Managed Care – PPO | Admitting: Internal Medicine

## 2011-02-21 ENCOUNTER — Encounter: Payer: Self-pay | Admitting: Internal Medicine

## 2011-02-21 ENCOUNTER — Ambulatory Visit (INDEPENDENT_AMBULATORY_CARE_PROVIDER_SITE_OTHER): Payer: BC Managed Care – PPO | Admitting: Internal Medicine

## 2011-02-21 VITALS — BP 102/62 | HR 76 | Temp 97.6°F | Ht 62.0 in | Wt 148.0 lb

## 2011-02-21 DIAGNOSIS — J029 Acute pharyngitis, unspecified: Secondary | ICD-10-CM | POA: Insufficient documentation

## 2011-02-21 MED ORDER — AZITHROMYCIN 250 MG PO TABS: ORAL_TABLET | ORAL | Status: AC

## 2011-02-21 NOTE — Assessment & Plan Note (Signed)
Mild to mod, for antibx course,  to f/u any worsening symptoms or concerns, cant r/o strep

## 2011-02-21 NOTE — Patient Instructions (Signed)
Take all new medications as prescribed Continue all other medications as before You can also take Delsym OTC for cough, and/or Mucinex (or it's generic off brand) for congestion  

## 2011-02-21 NOTE — Progress Notes (Signed)
  Subjective:    Patient ID: Regina Fox, female    DOB: [birth year], 31 y.o.   MRN: [id number]  HPI here to c/o acute onset ST for 3 wks, gradually worse in the past 2 wks since her son dx and tx for strep;  Also with HA, fatigue, sorness to the anterior neck under the chin, slight cough, and left ear aching.,  No chills and Pt denies chest pain, increased sob or doe, wheezing, orthopnea, PND, increased LE swelling, palpitations, dizziness or syncope.  Not pregnant Past Medical History  Diagnosis Date  . URI 08/22/2007  . RASH-NONVESICULAR 10/01/2009  . Allergic rhinitis, cause unspecified 07/18/2010   Past Surgical History  Procedure Date  . Denied surgical hx     reports that she has never smoked. She does not have any smokeless tobacco history on file. She reports that she does not drink alcohol or use illicit drugs. family history is not on file. No Known Allergies Current Outpatient Prescriptions on File Prior to Visit  Medication Sig Dispense Refill  . isometheptene-acetaminophen-dichloralphenazone (MIDRIN) 65-325-100 MG capsule Take 1 capsule by mouth every 4 (four) hours as needed.  30 capsule  2   Review of Systems Review of Systems  Constitutional: Negative for diaphoresis and unexpected weight change.  HENT: Negative for drooling and tinnitus.   Eyes: Negative for photophobia and visual disturbance.  Respiratory: Negative for choking and stridor.   Gastrointestinal: Negative for vomiting and blood in stool.  Genitourinary: Negative for hematuria and decreased urine volume.     Objective:   Physical Exam BP 102/62  Pulse 76  Temp(Src) 97.6 F (36.4 C) (Oral)  Ht 5\' 2"  (1.575 m)  Wt 148 lb (67.132 kg)  BMI 27.07 kg/m2  SpO2 98% Physical Exam  VS noted, mild ill Constitutional: Pt appears well-developed and well-nourished.  HENT: Head: Normocephalic.  Right Ear: External ear normal.  Left Ear: External ear mild erythema Bilat tm's mild erythema.  Sinus tender bilat.   Pharynx mild erythema with exudate Eyes: Conjunctivae and EOM are normal. Pupils are equal, round, and reactive to light.  Neck: Normal range of motion. Neck supple. mild tender bilat submandib LA noted Cardiovascular: Normal rate and regular rhythm.   Pulmonary/Chest: Effort normal and breath sounds normal.  Psychiatric: Pt behavior is normal. Thought content normal.     Assessment & Plan:

## 2011-04-27 ENCOUNTER — Other Ambulatory Visit: Payer: Self-pay | Admitting: Obstetrics and Gynecology

## 2011-04-27 ENCOUNTER — Other Ambulatory Visit (HOSPITAL_COMMUNITY)
Admission: RE | Admit: 2011-04-27 | Discharge: 2011-04-27 | Disposition: A | Discharge status: Home / Self Care | Payer: BC Managed Care – PPO | Source: Ambulatory Visit | Attending: Obstetrics and Gynecology | Admitting: Obstetrics and Gynecology

## 2011-04-27 DIAGNOSIS — Z124 Encounter for screening for malignant neoplasm of cervix: Secondary | ICD-10-CM | POA: Insufficient documentation

## 2011-04-28 ENCOUNTER — Other Ambulatory Visit: Payer: Self-pay | Admitting: Obstetrics and Gynecology

## 2011-12-11 ENCOUNTER — Encounter: Payer: BC Managed Care – PPO | Admitting: Internal Medicine

## 2012-02-12 ENCOUNTER — Other Ambulatory Visit (INDEPENDENT_AMBULATORY_CARE_PROVIDER_SITE_OTHER): Payer: BC Managed Care – PPO

## 2012-02-12 ENCOUNTER — Telehealth: Payer: Self-pay

## 2012-02-12 ENCOUNTER — Other Ambulatory Visit: Payer: Self-pay | Admitting: Internal Medicine

## 2012-02-12 DIAGNOSIS — E559 Vitamin D deficiency, unspecified: Secondary | ICD-10-CM

## 2012-02-12 DIAGNOSIS — Z0000 Encounter for general adult medical examination without abnormal findings: Secondary | ICD-10-CM

## 2012-02-12 LAB — URINALYSIS, ROUTINE W REFLEX MICROSCOPIC
Bilirubin Urine: NEGATIVE
Hgb urine dipstick: NEGATIVE
Ketones, ur: NEGATIVE
Leukocytes, UA: NEGATIVE
Specific Gravity, Urine: >=1.03 (ref 1.000–1.030)
Urobilinogen, UA: 0.2 (ref 0.0–1.0)

## 2012-02-12 LAB — CBC WITH DIFFERENTIAL/PLATELET
Basophils Absolute: 0 10*3/uL (ref 0.0–0.1)
Eosinophils Relative: 1 % (ref 0.0–5.0)
Lymphocytes Relative: 31.2 % (ref 12.0–46.0)
Lymphs Abs: 1.5 10*3/uL (ref 0.7–4.0)
Monocytes Relative: 7.8 % (ref 3.0–12.0)
Neutrophils Relative %: 59.3 % (ref 43.0–77.0)
Platelets: 209 10*3/uL (ref 150.0–400.0)
RDW: 12.2 % (ref 11.5–14.6)
WBC: 4.9 10*3/uL (ref 4.5–10.5)

## 2012-02-12 LAB — LIPID PANEL
HDL: 55.2 mg/dL (ref >39.00)
LDL Cholesterol: 111 mg/dL — ABNORMAL HIGH (ref 0–99)
Total CHOL/HDL Ratio: 3
VLDL: 6 mg/dL (ref 0.0–40.0)

## 2012-02-12 LAB — HEPATIC FUNCTION PANEL
ALT: 22 U/L (ref 0–35)
Total Bilirubin: 1.2 mg/dL (ref 0.3–1.2)

## 2012-02-12 LAB — BASIC METABOLIC PANEL
BUN: 14 mg/dL (ref 6–23)
Chloride: 103 mEq/L (ref 96–112)
Creatinine, Ser: 0.7 mg/dL (ref 0.4–1.2)

## 2012-02-12 LAB — TSH: TSH: 1.21 u[IU]/mL (ref 0.35–5.50)

## 2012-02-12 NOTE — Telephone Encounter (Signed)
Put lab order in. 

## 2012-02-13 LAB — VITAMIN D 25 HYDROXY (VIT D DEFICIENCY, FRACTURES): Vit D, 25-Hydroxy: 23 ng/mL — ABNORMAL LOW (ref 30–89)

## 2012-02-14 ENCOUNTER — Encounter: Payer: BC Managed Care – PPO | Admitting: Internal Medicine

## 2012-02-15 ENCOUNTER — Encounter: Payer: Self-pay | Admitting: Internal Medicine

## 2012-02-15 ENCOUNTER — Ambulatory Visit (INDEPENDENT_AMBULATORY_CARE_PROVIDER_SITE_OTHER): Payer: BC Managed Care – PPO | Admitting: Internal Medicine

## 2012-02-15 VITALS — BP 102/70 | HR 74 | Temp 99.0°F | Ht 62.0 in | Wt 149.5 lb

## 2012-02-15 DIAGNOSIS — R21 Rash and other nonspecific skin eruption: Secondary | ICD-10-CM

## 2012-02-15 DIAGNOSIS — Z0000 Encounter for general adult medical examination without abnormal findings: Secondary | ICD-10-CM

## 2012-02-15 DIAGNOSIS — E559 Vitamin D deficiency, unspecified: Secondary | ICD-10-CM

## 2012-02-15 HISTORY — DX: Vitamin D deficiency, unspecified: E55.9

## 2012-02-15 MED ORDER — CLOTRIMAZOLE-BETAMETHASONE 1-0.05 % EX CREA: TOPICAL_CREAM | CUTANEOUS | Status: DC

## 2012-02-15 NOTE — Assessment & Plan Note (Signed)
Mild, for vit d otc 1000 units per day

## 2012-02-15 NOTE — Patient Instructions (Addendum)
Please start the OTC Vit D at 1000 units per day Please continue all other medications as before, and refills have been done if requested. Please have the pharmacy call with any other refills you may need. Please continue your efforts at being more active, low cholesterol diet, and weight control. Please remember to followup with your GYN for the yearly pap smear You are otherwise up to date with prevention measures today. Thank you for enrolling in MyChart. Please follow the instructions below to securely access your online medical record. MyChart allows you to send messages to your doctor, view your test results, renew your prescriptions, schedule appointments, and more To Log into My Chart online, please go by [facility] or [employer] to [email or web address], or download the MyChart App from the [employer] .  Your Username is: [email or web address]  (pass [phone]) Please send a practice Message on Mychart later today. Please return in 1 year for your yearly visit, or sooner if needed, with Lab testing done 3-5 days before

## 2012-02-15 NOTE — Assessment & Plan Note (Signed)

## 2012-02-15 NOTE — Progress Notes (Signed)
Subjective:    Patient ID: Regina Fox, female    DOB: 1980/05/22, 32 y.o.   MRN: [id number]  HPI  Here for wellness and f/u;  Overall doing ok;  Pt denies CP, worsening SOB, DOE, wheezing, orthopnea, PND, worsening LE edema, palpitations, dizziness or syncope.  Pt denies neurological change such as new headache, facial or extremity weakness.  Pt denies polydipsia, polyuria, or low sugar symptoms. Pt states overall good compliance with treatment and medications, good tolerability, and has been trying to follow lower cholesterol diet.  Pt denies worsening depressive symptoms, suicidal ideation or panic. No fever, night sweats, wt loss, loss of appetite, or other constitutional symptoms.  Pt states good ability with ADL's, has low fall risk, home safety reviewed and adequate, no other significant changes in hearing or vision, and only occasionally active with exercise.  Sees her GYN on yearly basis.  Has mild occasional allergy symptoms, as well as occas mild viral URI's when her children have symtpoms.  Trying for third pregnancy now Past Medical History  Diagnosis Date  . URI 08/22/2007  . RASH-NONVESICULAR 10/01/2009  . Allergic rhinitis, cause unspecified 07/18/2010   Past Surgical History  Procedure Date  . Denied surgical hx     reports that she has never smoked. She does not have any smokeless tobacco history on file. She reports that she does not drink alcohol or use illicit drugs. family history is not on file. No Known Allergies Current Outpatient Prescriptions on File Prior to Visit  Medication Sig Dispense Refill  . isometheptene-acetaminophen-dichloralphenazone (MIDRIN) 65-325-100 MG capsule Take 1 capsule by mouth every 4 (four) hours as needed.  30 capsule  2   Review of Systems Constitutional: Negative for diaphoresis, activity change, appetite change or unexpected weight change.  HENT: Negative for hearing loss, ear pain, facial swelling, mouth sores and neck stiffness.   Eyes:  Negative for pain, redness and visual disturbance.  Respiratory: Negative for shortness of breath and wheezing.   Cardiovascular: Negative for chest pain and palpitations.  Gastrointestinal: Negative for diarrhea, blood in stool, abdominal distention or other pain Genitourinary: Negative for hematuria, flank pain or change in urine volume.  Musculoskeletal: Negative for myalgias and joint swelling.  Skin: Negative for color change and wound.  Neurological: Negative for syncope and numbness. other than noted Hematological: Negative for adenopathy.  Psychiatric/Behavioral: Negative for hallucinations, self-injury, decreased concentration and agitation.      Objective:   Physical Exam BP 102/70  Pulse 74  Temp 99 F (37.2 C) (Oral)  Ht 5\' 2"  (1.575 m)  Wt 149 lb 8 oz (67.813 kg)  BMI 27.34 kg/m2  SpO2 98% VS noted,  Constitutional: Pt is oriented to person, place, and time. Appears well-developed and well-nourished.  Head: Normocephalic and atraumatic.  Right Ear: External ear normal.  Left Ear: External ear normal.  Nose: Nose normal.  Mouth/Throat: Oropharynx is clear and moist.  Eyes: Conjunctivae and EOM are normal. Pupils are equal, round, and reactive to light.  Neck: Normal range of motion. Neck supple. No JVD present. No tracheal deviation present.  Cardiovascular: Normal rate, regular rhythm, normal heart sounds and intact distal pulses.   Pulmonary/Chest: Effort normal and breath sounds normal.  Abdominal: Soft. Bowel sounds are normal. There is no tenderness. No HSM  Musculoskeletal: Normal range of motion. Exhibits no edema.  Lymphadenopathy:  Has no cervical adenopathy.  Neurological: Pt is alert and oriented to person, place, and time. Pt has normal reflexes. No cranial nerve deficit.  Skin: Skin is warm and dry. No rash noted.  Psychiatric:  Has  normal mood and affect. Behavior is normal.     Assessment & Plan:

## 2012-02-15 NOTE — Assessment & Plan Note (Signed)
None today, but likely recurrent mild eczema, Mild to mod, for cream prn,  to f/u any worsening symptoms

## 2012-03-02 ENCOUNTER — Other Ambulatory Visit: Payer: Self-pay

## 2012-03-27 ENCOUNTER — Encounter: Payer: Self-pay | Admitting: Internal Medicine

## 2012-03-27 ENCOUNTER — Ambulatory Visit (INDEPENDENT_AMBULATORY_CARE_PROVIDER_SITE_OTHER): Payer: BC Managed Care – PPO | Admitting: Internal Medicine

## 2012-03-27 VITALS — BP 100/62 | HR 78 | Temp 97.8°F | Ht 62.0 in | Wt 150.5 lb

## 2012-03-27 DIAGNOSIS — J309 Allergic rhinitis, unspecified: Secondary | ICD-10-CM

## 2012-03-27 DIAGNOSIS — J019 Acute sinusitis, unspecified: Secondary | ICD-10-CM

## 2012-03-27 MED ORDER — AZITHROMYCIN 250 MG PO TABS: ORAL_TABLET | ORAL | Status: DC

## 2012-03-27 MED ORDER — FLUTICASONE PROPIONATE 50 MCG/ACT NA SUSP: 2.0000 | Freq: Every day | NASAL | Status: DC

## 2012-03-27 NOTE — Progress Notes (Signed)
  Subjective:    Patient ID: Regina Fox, female    DOB: September 13, 1980, 32 y.o.   MRN: [id number]  HPI   Here with 2-3 days acute onset fever, facial pain, pressure, headache, general weakness and malaise, and greenish d/c, with mild ST and cough, but pt denies chest pain, wheezing, increased sob or doe, orthopnea, PND, increased LE swelling, palpitations, dizziness or syncope.  Trying to become pregnant but not currently.  Does have several prior wks ongoing nasal allergy symptoms with clearish congestion, itch and sneezing, without fever, pain, ST, cough, swelling or wheezing. Zyrtec not working well during this time.   Past Medical History  Diagnosis Date  . URI 08/22/2007  . RASH-NONVESICULAR 10/01/2009  . Allergic rhinitis, cause unspecified 07/18/2010  . Vitamin D deficiency 02/15/2012   Past Surgical History  Procedure Laterality Date  . Denied surgical hx      reports that she has never smoked. She does not have any smokeless tobacco history on file. She reports that she does not drink alcohol or use illicit drugs. family history is not on file. No Known Allergies Current Outpatient Prescriptions on File Prior to Visit  Medication Sig Dispense Refill  . isometheptene-acetaminophen-dichloralphenazone (MIDRIN) 65-325-100 MG capsule Take 1 capsule by mouth every 4 (four) hours as needed.  30 capsule  2   No current facility-administered medications on file prior to visit.   Review of Systems All otherwise neg per pt     Objective:   Physical Exam BP 100/62  Pulse 78  Temp(Src) 97.8 F (36.6 C) (Oral)  Ht 5\' 2"  (1.575 m)  Wt 150 lb 8 oz (68.266 kg)  BMI 27.52 kg/m2  SpO2 98% VS noted, mild ill Constitutional: Pt appears well-developed and well-nourished.  HENT: Head: NCAT.  Right Ear: External ear normal.  Left Ear: External ear normal.  Eyes: Conjunctivae and EOM are normal. Pupils are equal, round, and reactive to light.  Bilat tm's with mild erythema.  Max sinus areas mild  tender.  Pharynx with mild erythema, no exudate Neck: Normal range of motion. Neck supple.  Cardiovascular: Normal rate and regular rhythm.   Pulmonary/Chest: Effort normal and breath sounds normal. - no rales or wheezing Neurological: Pt is alert. Not confused  Skin: Skin is warm. No erythema.  Psychiatric: Pt behavior is normal. Thought content normal.     Assessment & Plan:

## 2012-03-27 NOTE — Assessment & Plan Note (Signed)
Mild to mod, for flonase asd,  to f/u any worsening symptoms or concerns  

## 2012-03-27 NOTE — Assessment & Plan Note (Signed)
Mild to mod, for antibx course,  to f/u any worsening symptoms or concerns 

## 2012-03-27 NOTE — Patient Instructions (Signed)
Please take all new medication as prescribed - the antibiotic for the infection, and flonase for the allergy part Please continue all other medications as before, and refills have been done if requested. You can also take Sudafed OTC at 30 mg every 6 hrs, but dont take before bedtime as this can make sleeping more difficult Thank you for enrolling in MyChart. Please follow the instructions below to securely access your online medical record. MyChart allows you to send messages to your doctor, view your test results, renew your prescriptions, schedule appointments, and more.

## 2012-05-13 ENCOUNTER — Encounter: Payer: Self-pay | Admitting: Internal Medicine

## 2012-05-14 MED ORDER — AZELASTINE-FLUTICASONE 137-50 MCG/ACT NA SUSP: 1.0000 | Freq: Two times a day (BID) | NASAL | Status: DC

## 2012-05-15 ENCOUNTER — Telehealth: Payer: Self-pay | Admitting: *Deleted

## 2012-05-15 NOTE — Telephone Encounter (Signed)
Rcd fax from [facility] for PA of [employer]ym[employer]-PA approved 04/15/2012-05/15/2013. [facility] informed.

## 2012-06-13 ENCOUNTER — Observation Stay (HOSPITAL_COMMUNITY)
Admission: AD | Admit: 2012-06-13 | Discharge: 2012-06-14 | Disposition: A | Discharge status: Home / Self Care | Payer: BC Managed Care – PPO | Source: Ambulatory Visit | Attending: Obstetrics and Gynecology | Admitting: Obstetrics and Gynecology

## 2012-06-13 ENCOUNTER — Encounter (HOSPITAL_COMMUNITY): Payer: Self-pay | Admitting: *Deleted

## 2012-06-13 ENCOUNTER — Inpatient Hospital Stay (HOSPITAL_COMMUNITY): Payer: BC Managed Care – PPO

## 2012-06-13 DIAGNOSIS — R1032 Left lower quadrant pain: Secondary | ICD-10-CM | POA: Insufficient documentation

## 2012-06-13 DIAGNOSIS — O00109 Unspecified tubal pregnancy without intrauterine pregnancy: Principal | ICD-10-CM | POA: Insufficient documentation

## 2012-06-13 DIAGNOSIS — K661 Hemoperitoneum: Secondary | ICD-10-CM

## 2012-06-13 LAB — CBC
Platelets: 227 10*3/uL (ref 150–400)
RBC: 4.22 MIL/uL (ref 3.87–5.11)
WBC: 12 10*3/uL — ABNORMAL HIGH (ref 4.0–10.5)

## 2012-06-13 LAB — ABO/RH: ABO/RH(D): B POS

## 2012-06-13 MED ORDER — FAMOTIDINE IN NACL 20-0.9 MG/50ML-% IV SOLN
20.0000 mg | Freq: Once | INTRAVENOUS | Status: AC
  Administered 2012-06-13: 20 mg via INTRAVENOUS
  Filled 2012-06-13: qty 50

## 2012-06-13 MED ORDER — HYDROMORPHONE HCL PF 1 MG/ML IJ SOLN
1.0000 mg | Freq: Once | INTRAMUSCULAR | Status: AC
  Administered 2012-06-13: 1 mg via INTRAVENOUS
  Filled 2012-06-13: qty 1

## 2012-06-13 MED ORDER — FAMOTIDINE IN NACL 20-0.9 MG/50ML-% IV SOLN: 20.0000 mg | Freq: Once | INTRAVENOUS | Status: DC

## 2012-06-13 NOTE — MAU Note (Signed)
Dr. Arlyce Dice called and reported pts BHCG 1500 on 06/12/2012.

## 2012-06-13 NOTE — MAU Note (Signed)
Pt with +UPT in the office.  Having lower abd pain and pressure x 2 days.  Pt had labs done in the office 5/28.  Recent MAB 5wks ago.

## 2012-06-13 NOTE — H&P (Signed)
Regina Fox is an 32 y.o. female. Presents for worsening abdominal pain  32 yo [id number] presents for a chief complaint of abdominal pain.  The patient was seen in my office yesterday for a positive pregnancy test and abdominal pain.  She has a h/o of a prior ectopic pregnancy which was treated surgically with a salpingostomy.  Approximately 5 weeks ago she had a spontaneous abortion.  She was managed by another practice for that pregnancy.  She was followed with quantitative HCGs.  The last quant that the patient was aware of was 40.  She began having increased abdominal pain and had a + pregnancy test. She was seen in the office yesterday. A quant was 1500 but no ultrasound was performed.  This evening her pain increased and she presented for evaluation.  An ultrasound confirmed a left adnexal mass & embryo within the left tube, probably ruptured ectopic pregnancy.  The findings were discussed with the patient and her husband at length.  Given the findings I've advised the patient that surgical management is required.  R/B/A reviewed the patient at length. Will proceed with Diagnostic laparoscopy, possible laparotomy, salpingectomy versus salpingostomy  No LMP recorded. Patient is not currently having periods (Reason: Needs Pregnancy Test).    Past Medical History  Diagnosis Date  . URI 08/22/2007  . RASH-NONVESICULAR 10/01/2009  . Allergic rhinitis, cause unspecified 07/18/2010  . Vitamin D deficiency 02/15/2012    Past Surgical History  Procedure Laterality Date  . Denied surgical hx    . Ectopic pregnancy surgery      No family history on file.  Social History:  reports that she has never smoked. She does not have any smokeless tobacco history on file. She reports that she does not drink alcohol or use illicit drugs.  Allergies: No Known Allergies  Prescriptions prior to admission  Medication Sig Dispense Refill  . acetaminophen (TYLENOL) 325 MG tablet Take 325 mg by mouth every 6 (six)  hours as needed for pain.      . Azelastine-Fluticasone (DYMISTA) 137-50 MCG/ACT SUSP Place 1 spray into the nose 2 (two) times daily.  23 g  5  . Naphazoline-Pheniramine (OPCON-A OP) Place 1 drop into both eyes daily as needed.      . Prenatal Vit-Fe Fumarate-FA (PRENATAL MULTIVITAMIN) TABS Take 1 tablet by mouth daily at 12 noon.        ROS  Blood pressure 116/71, pulse 99, temperature 98.4 F (36.9 C), temperature source Oral, resp. rate 16, height 5\' 2"  (1.575 m), weight 69.128 kg (152 lb 6.4 oz). Physical Exam  Results for orders placed during the hospital encounter of 06/13/12 (from the past 24 hour(s))  CBC     Status: Abnormal   Collection Time    06/13/12 10:30 PM      Result Value Range   WBC 12.0 (*) 4.0 - 10.5 K/uL   RBC 4.22  3.87 - 5.11 MIL/uL   Hemoglobin 12.7  12.0 - 15.0 g/dL   HCT 14.7  82.9 - 56.2 %   MCV 86.7  78.0 - 100.0 fL   MCH 30.1  26.0 - 34.0 pg   MCHC 34.7  30.0 - 36.0 g/dL   RDW 13.0  86.5 - 78.4 %   Platelets 227  150 - 400 K/uL    US Ob Comp Less 14 Wks  06/13/2012   *RADIOLOGY REPORT*  Clinical Data: Pelvic pain.  Prior ectopic pregnancy.  OBSTETRIC <14 WK [place] AND TRANSVAGINAL OB US  Technique:  Both transabdominal and transvaginal ultrasound examinations were performed for complete evaluation of the gestation as well as the maternal uterus, adnexal regions, and pelvic cul-de-sac.  Transvaginal technique was performed to assess early pregnancy.  Comparison:  Pelvic ultrasound performed 08/02/2007  Intrauterine gestational sac:  None seen. Yolk sac: N/A Embryo: N/A  MSD (ectopic pregnancy): 3.5 mm  4 w 6 d  Maternal uterus/adnexae: There is a large echogenic mass at the left adnexa, measuring 6.7 x 4.7 x 3.5 cm, compatible with hemorrhage surrounding an ectopic pregnancy.  The adjacent left ovary is unremarkable in appearance.  A large amount of free fluid is noted within the pelvis, demonstrating internal echoes, raising concern for serosanguinous fluid  within the pelvis.  The right ovary is unremarkable in appearance, measuring 3.6 x 3.0 x 1.7 cm, while the left ovary measures 2.5 x 2.4 x 1.6 cm.  The uterus is unremarkable in appearance.  IMPRESSION: Large echogenic mass at the left adnexa, measuring 6.7 x 4.7 x 3.5 cm, compatible with hemorrhage surrounding an ectopic pregnancy. This is directly adjacent to the left ovary.  Large amount of associated free fluid within the pelvis demonstrates internal echoes, raising concern for serosanguinous fluid.  The gestational sac at the center of the mass measures 3.5 mm in size.  No intrauterine gestational sac seen.  These results were called by telephone on 06/13/2012 at 10:09 p.m. to Wynelle Bourgeois, who verbally acknowledged these results.   Original Report Authenticated By: Tonia Ghent, M.D.   US Ob Transvaginal  06/13/2012   *RADIOLOGY REPORT*  Clinical Data: Pelvic pain.  Prior ectopic pregnancy.  OBSTETRIC <14 WK [place] AND TRANSVAGINAL OB US  Technique:  Both transabdominal and transvaginal ultrasound examinations were performed for complete evaluation of the gestation as well as the maternal uterus, adnexal regions, and pelvic cul-de-sac.  Transvaginal technique was performed to assess early pregnancy.  Comparison:  Pelvic ultrasound performed 08/02/2007  Intrauterine gestational sac:  None seen. Yolk sac: N/A Embryo: N/A  MSD (ectopic pregnancy): 3.5 mm  4 w 6 d  Maternal uterus/adnexae: There is a large echogenic mass at the left adnexa, measuring 6.7 x 4.7 x 3.5 cm, compatible with hemorrhage surrounding an ectopic pregnancy.  The adjacent left ovary is unremarkable in appearance.  A large amount of free fluid is noted within the pelvis, demonstrating internal echoes, raising concern for serosanguinous fluid within the pelvis.  The right ovary is unremarkable in appearance, measuring 3.6 x 3.0 x 1.7 cm, while the left ovary measures 2.5 x 2.4 x 1.6 cm.  The uterus is unremarkable in appearance.  IMPRESSION:  Large echogenic mass at the left adnexa, measuring 6.7 x 4.7 x 3.5 cm, compatible with hemorrhage surrounding an ectopic pregnancy. This is directly adjacent to the left ovary.  Large amount of associated free fluid within the pelvis demonstrates internal echoes, raising concern for serosanguinous fluid.  The gestational sac at the center of the mass measures 3.5 mm in size.  No intrauterine gestational sac seen.  These results were called by telephone on 06/13/2012 at 10:09 p.m. to Wynelle Bourgeois, who verbally acknowledged these results.   Original Report Authenticated By: Tonia Ghent, M.D.    Assessment/Plan: 1) Admit 2) Consent for surgery 3) T&C for 2 units pRBCs  Vibha Ferdig H. 06/13/2012, 10:56 PM

## 2012-06-14 ENCOUNTER — Inpatient Hospital Stay (HOSPITAL_COMMUNITY): Payer: BC Managed Care – PPO | Admitting: Anesthesiology

## 2012-06-14 ENCOUNTER — Encounter (HOSPITAL_COMMUNITY)
Admission: AD | Disposition: A | Discharge status: Home / Self Care | Payer: Self-pay | Source: Ambulatory Visit | Attending: Obstetrics and Gynecology

## 2012-06-14 ENCOUNTER — Encounter (HOSPITAL_COMMUNITY): Payer: Self-pay | Admitting: Anesthesiology

## 2012-06-14 ENCOUNTER — Ambulatory Visit: Admit: 2012-06-14 | Payer: Self-pay | Admitting: Obstetrics and Gynecology

## 2012-06-14 ENCOUNTER — Encounter (HOSPITAL_COMMUNITY): Payer: Self-pay | Admitting: *Deleted

## 2012-06-14 HISTORY — PX: LAPAROSCOPY: SHX197

## 2012-06-14 LAB — CBC
MCH: 29.9 pg (ref 26.0–34.0)
Platelets: 209 10*3/uL (ref 150–400)
RBC: 4.08 MIL/uL (ref 3.87–5.11)
WBC: 8.7 10*3/uL (ref 4.0–10.5)

## 2012-06-14 LAB — TYPE AND SCREEN

## 2012-06-14 SURGERY — LAPAROSCOPY OPERATIVE
Anesthesia: General

## 2012-06-14 MED ORDER — OXYCODONE-ACETAMINOPHEN 5-325 MG PO TABS: 2.0000 | ORAL_TABLET | ORAL | Status: DC | PRN

## 2012-06-14 MED ORDER — MEPERIDINE HCL 25 MG/ML IJ SOLN: 6.2500 mg | INTRAMUSCULAR | Status: DC | PRN

## 2012-06-14 MED ORDER — IBUPROFEN 600 MG PO TABS: 600.0000 mg | ORAL_TABLET | Freq: Four times a day (QID) | ORAL | Status: DC | PRN

## 2012-06-14 MED ORDER — ONDANSETRON HCL 4 MG PO TABS: 4.0000 mg | ORAL_TABLET | Freq: Four times a day (QID) | ORAL | Status: DC | PRN

## 2012-06-14 MED ORDER — ONDANSETRON HCL 4 MG/2ML IJ SOLN: 4.0000 mg | Freq: Once | INTRAMUSCULAR | Status: DC | PRN

## 2012-06-14 MED ORDER — FENTANYL CITRATE 0.05 MG/ML IJ SOLN: 25.0000 ug | INTRAMUSCULAR | Status: DC | PRN

## 2012-06-14 MED ORDER — DEXAMETHASONE SODIUM PHOSPHATE 4 MG/ML IJ SOLN
INTRAMUSCULAR | Status: DC | PRN
  Administered 2012-06-14: 10 mg via INTRAVENOUS

## 2012-06-14 MED ORDER — FENTANYL CITRATE 0.05 MG/ML IJ SOLN
INTRAMUSCULAR | Status: DC | PRN
  Administered 2012-06-14 (×3): 50 ug via INTRAVENOUS

## 2012-06-14 MED ORDER — KETOROLAC TROMETHAMINE 30 MG/ML IJ SOLN: 15.0000 mg | Freq: Once | INTRAMUSCULAR | Status: DC | PRN

## 2012-06-14 MED ORDER — ONDANSETRON HCL 4 MG/2ML IJ SOLN: 4.0000 mg | Freq: Four times a day (QID) | INTRAMUSCULAR | Status: DC | PRN

## 2012-06-14 MED ORDER — LIDOCAINE-EPINEPHRINE (PF) 1 %-1:200000 IJ SOLN
INTRAMUSCULAR | Status: DC | PRN
  Administered 2012-06-14: 30 mL

## 2012-06-14 MED ORDER — ROCURONIUM BROMIDE 100 MG/10ML IV SOLN
INTRAVENOUS | Status: DC | PRN
  Administered 2012-06-14: 10 mg via INTRAVENOUS
  Administered 2012-06-14: 5 mg via INTRAVENOUS
  Administered 2012-06-14: 20 mg via INTRAVENOUS

## 2012-06-14 MED ORDER — MIDAZOLAM HCL 5 MG/5ML IJ SOLN
INTRAMUSCULAR | Status: DC | PRN
  Administered 2012-06-14: 2 mg via INTRAVENOUS

## 2012-06-14 MED ORDER — SUCCINYLCHOLINE CHLORIDE 20 MG/ML IJ SOLN
INTRAMUSCULAR | Status: DC | PRN
  Administered 2012-06-14: 120 mg via INTRAVENOUS

## 2012-06-14 MED ORDER — ONDANSETRON HCL 4 MG/2ML IJ SOLN
INTRAMUSCULAR | Status: DC | PRN
  Administered 2012-06-14: 4 mg via INTRAVENOUS

## 2012-06-14 MED ORDER — KETOROLAC TROMETHAMINE 30 MG/ML IJ SOLN
INTRAMUSCULAR | Status: DC | PRN
  Administered 2012-06-14: 30 mg via INTRAVENOUS

## 2012-06-14 MED ORDER — LACTATED RINGERS IV SOLN
INTRAVENOUS | Status: DC | PRN
  Administered 2012-06-14: via INTRAVENOUS

## 2012-06-14 MED ORDER — NEOSTIGMINE METHYLSULFATE 1 MG/ML IJ SOLN
INTRAMUSCULAR | Status: DC | PRN
  Administered 2012-06-14: 4 mg via INTRAVENOUS

## 2012-06-14 MED ORDER — OXYCODONE-ACETAMINOPHEN 5-325 MG PO TABS
1.0000 | ORAL_TABLET | ORAL | Status: DC | PRN
  Administered 2012-06-14: 1 via ORAL
  Filled 2012-06-14: qty 1

## 2012-06-14 MED ORDER — CEFAZOLIN SODIUM-DEXTROSE 2-3 GM-% IV SOLR
INTRAVENOUS | Status: DC | PRN
  Administered 2012-06-14: 2 g via INTRAVENOUS

## 2012-06-14 MED ORDER — LACTATED RINGERS IV SOLN
INTRAVENOUS | Status: DC
  Administered 2012-06-14: 04:00:00 via INTRAVENOUS

## 2012-06-14 MED ORDER — HYDROMORPHONE HCL PF 1 MG/ML IJ SOLN: 0.2000 mg | INTRAMUSCULAR | Status: DC | PRN

## 2012-06-14 MED ORDER — PROPOFOL 10 MG/ML IV BOLUS
INTRAVENOUS | Status: DC | PRN
  Administered 2012-06-14: 200 mg via INTRAVENOUS

## 2012-06-14 MED ORDER — DOCUSATE SODIUM 100 MG PO CAPS: 100.0000 mg | ORAL_CAPSULE | Freq: Two times a day (BID) | ORAL | Status: DC

## 2012-06-14 MED ORDER — GLYCOPYRROLATE 0.2 MG/ML IJ SOLN
INTRAMUSCULAR | Status: DC | PRN
  Administered 2012-06-14: 0.6 mg via INTRAVENOUS

## 2012-06-14 MED ORDER — LIDOCAINE HCL (CARDIAC) 20 MG/ML IV SOLN
INTRAVENOUS | Status: DC | PRN
  Administered 2012-06-14: 30 mg via INTRAVENOUS

## 2012-06-14 MED ORDER — IBUPROFEN 600 MG PO TABS
600.0000 mg | ORAL_TABLET | Freq: Four times a day (QID) | ORAL | Status: DC | PRN
  Administered 2012-06-14: 600 mg via ORAL
  Filled 2012-06-14: qty 1

## 2012-06-14 SURGICAL SUPPLY — 28 items
ADH SKN CLS APL DERMABOND .7 (GAUZE/BANDAGES/DRESSINGS)
BAG SPEC RTRVL LRG 6X4 10 (ENDOMECHANICALS) ×1
CANISTER SUCTION 2500CC (MISCELLANEOUS) ×2 IMPLANT
CLOTH BEACON ORANGE TIMEOUT ST (SAFETY) ×2 IMPLANT
CONT PATH 16OZ SNAP LID 3702 (MISCELLANEOUS) IMPLANT
DECANTER SPIKE VIAL GLASS SM (MISCELLANEOUS) IMPLANT
DERMABOND ADVANCED (GAUZE/BANDAGES/DRESSINGS)
DERMABOND ADVANCED .7 DNX12 (GAUZE/BANDAGES/DRESSINGS) IMPLANT
DRSG COVADERM PLUS 2X2 (GAUZE/BANDAGES/DRESSINGS) ×1 IMPLANT
FORCEPS CUTTING 33CM 5MM (CUTTING FORCEPS) ×1 IMPLANT
FORCEPS CUTTING 45CM 5MM (CUTTING FORCEPS) IMPLANT
GLOVE BIO SURGEON STRL SZ7 (GLOVE) ×2 IMPLANT
GOWN PREVENTION PLUS LG XLONG (DISPOSABLE) ×4 IMPLANT
NDL SPNL 22GX3.5 QUINCKE BK (NEEDLE) IMPLANT
NEEDLE HYPO 22GX1.5 SAFETY (NEEDLE) IMPLANT
NEEDLE SPNL 22GX3.5 QUINCKE BK (NEEDLE) IMPLANT
NS IRRIG 1000ML POUR BTL (IV SOLUTION) ×2 IMPLANT
PACK LAPAROSCOPY BASIN (CUSTOM PROCEDURE TRAY) ×2 IMPLANT
POUCH SPECIMEN RETRIEVAL 10MM (ENDOMECHANICALS) ×1 IMPLANT
PROTECTOR NERVE ULNAR (MISCELLANEOUS) ×2 IMPLANT
SUT VIC AB 3-0 PS2 18 (SUTURE) ×2
SUT VIC AB 3-0 PS2 18XBRD (SUTURE) ×1 IMPLANT
SUT VICRYL 0 UR6 27IN ABS (SUTURE) ×4 IMPLANT
TOWEL OR 17X24 6PK STRL BLUE (TOWEL DISPOSABLE) ×4 IMPLANT
TROCAR BALLN 12MMX100 BLUNT (TROCAR) ×2 IMPLANT
TROCAR OPTI TIP 5M 100M (ENDOMECHANICALS) ×4 IMPLANT
WARMER LAPAROSCOPE (MISCELLANEOUS) ×2 IMPLANT
WATER STERILE IRR 1000ML POUR (IV SOLUTION) ×2 IMPLANT

## 2012-06-14 NOTE — Op Note (Signed)
Pre-Operative Diagnosis: 1) Ruptured left ectopic pregnancy Postoperative Diagnosis: Same Procedure: Laparoscopic Left Salpingectomy Surgeon: Dr. Waynard Reeds Assistant: None Anesthesia: General and 1% Lidocaine Operative Findings: Ruptured left ectopic pregnancy  Specimen: Left fallopian tube and ectopic pregnancy EBL: 200cc  Procedure: Ms. Kaster is a 32 year old gravida 4 para 2012 [place] female who presents to the maternity admissions complaining of left lower quadrant pain. She had a positive pregnancy test recently and started having lower, pain. She was seen in my office yesterday and had a quantitative hCG drawn which was 1500. She was having some left lower quadrant pain yesterday but it was not severe and a pelvic ultrasound was not performed. Today her pain has gotten progressively worse and she presented for evaluation. A pelvic ultrasound was performed which confirmed a left ruptured ectopic pregnancy with out concomitant intrauterine pregnancy. The findings of the ultrasound were discussed with the patient and her husband at length. The patient has a history of a left-sided ectopic pregnancy that was surgically managed him with salpingostomy in 2006. Given the recurrence of an ectopic pregnancy on the left side and the fact that based on ultrasound it appeared to be ruptured I advised the patient not salpingectomy was likely to be required. Following the appropriate informed consent the patient was brought to the operating room where general anesthesia was administered. She was placed in the dorsal lithotomy position in [place] stirrups. The abdomen and perineum were prepped and draped in normal sterile fashion. The patient was appropriately identified during a time out procedure. A speculum was placed in the vagina and a [place]ulka uterine manipulator was placed in the uterus. Attention was then turned to the abdominal portion of the case after gloves were changed. 5 cc of 1% lidocaine was  injected infraumbilically. The umbilicus was tented up and a vertical skin incision was made infraumbilically. The abdominal cavity was tented up the very needle was then introduced intra-abdominal placement was confirmed with a saline drop test however on insufflation high flow insufflation could not be achieved. Due to this the varies needle was removed S. Retractors were used to dissect down to the level of the fascia and the fascia was grasped with Kelly clamps x2, tented up, and incised with Mayo scissors. Abdominal contents could be visualized confirming intra-abdominal entry. A Hassan port was placed, the abdomen was completely insufflated, and the camera was introduced. A survey of the abdominal cavity was performed. The appendix was noted to be normal. The liver and gallbladder were normal. The stomach was not visualized. Within the pelvis the uterus and right tube and ovary were normal. The left ovary was normal however the left tube was dilated with clot adherent to the clubbed fimbriated end of the tube consistent with ectopic pregnancy. There was active bleeding noted in the distal and of the tube. Under direct visualization a 5 mm right lower quadrant port site was placed and a 10 mm left lower quadrant port site was placed. The patient was placed in Trendelenburg. A blunt probe was used to sweep the bowels out of the pelvis. An atraumatic grasper was used to tent up the ectopic and the gyrus was used to take serial bites along the fallopian tube from the proximal end at the level of the uterus along the mesosalpinx toward the distal end of the fallopian tube until the fallopian tube until this completely detached. The ectopic was placed in the pelvis and a 10 mm Endobag was placed in the left lower quadrant port site.  The specimen was placed under direct visualization in the Endobag and the Endobag was removed and passed off. The left lower quadrant port site was for placed a final survey of the  abdominal cavity was performed. The abdomen and pelvis were your dated and cleared of all clot and debris. The right and left lower quadrant port sites were removed under direct visualization. The abdominal cavity was desufflated. The fascia at the umbilical port site was repaired 0 Vicryl in a running fashion. The the fascia at the left lower quadrant port site was also reapproximated with 0 Vicryl. The skin at the umbilicus and level quadrant port sites were repaired in a subcuticular fashion with 4-0 Vicryl. Dermabond was used to seal the skin at all 3 port sites. This completed the procedure the patient was taken to the recovery room in stable condition following the procedure. All sponge lap needle counts were correct x2.

## 2012-06-14 NOTE — Anesthesia Postprocedure Evaluation (Signed)
Anesthesia Post Note  Patient: Industrial/product designer  Procedure(s) Performed: Procedure(s) (LRB): LAPAROSCOPY OPERATIVE (N/A)  Anesthesia type: General  Patient location: PACU  Post pain: Pain level controlled  Post assessment: Post-op Vital signs reviewed  Last Vitals:  Filed Vitals:   06/14/12 0215  BP: 109/60  Pulse: 73  Temp:   Resp: 27    Post vital signs: Reviewed  Level of consciousness: sedated  Complications: No apparent anesthesia complications

## 2012-06-14 NOTE — Anesthesia Preprocedure Evaluation (Signed)
Anesthesia Evaluation  Patient identified by MRN, date of birth, ID band Patient awake    Reviewed: Allergy & Precautions, H&P , NPO status , Patient's Chart, lab work & pertinent test results  Airway Mallampati: II TM Distance: >3 FB Neck ROM: full    Dental no notable dental hx. (+) Teeth Intact   Pulmonary neg pulmonary ROS,    Pulmonary exam normal       Cardiovascular negative cardio ROS      Neuro/Psych negative neurological ROS  negative psych ROS   GI/Hepatic negative GI ROS, Neg liver ROS,   Endo/Other  negative endocrine ROS  Renal/GU negative Renal ROS  negative genitourinary   Musculoskeletal negative musculoskeletal ROS (+)   Abdominal Normal abdominal exam  (+)   Peds negative pediatric ROS (+)  Hematology negative hematology ROS (+)   Anesthesia Other Findings   Reproductive/Obstetrics negative OB ROS                           Anesthesia Physical Anesthesia Plan  ASA: I  Anesthesia Plan: General   Post-op Pain Management:    Induction: Intravenous  Airway Management Planned: Oral ETT  Additional Equipment:   Intra-op Plan:   Post-operative Plan: Extubation in OR  Informed Consent: I have reviewed the patients History and Physical, chart, labs and discussed the procedure including the risks, benefits and alternatives for the proposed anesthesia with the patient or authorized representative who has indicated his/her understanding and acceptance.   Dental Advisory Given  Plan Discussed with: CRNA and Surgeon  Anesthesia Plan Comments:         Anesthesia Quick Evaluation

## 2012-06-14 NOTE — Anesthesia Postprocedure Evaluation (Signed)
  Anesthesia Post-op Note  Patient: Industrial/product designer  Procedure(s) Performed: Procedure(s): LAPAROSCOPY OPERATIVE (N/A)  Patient Location: [facility] unit  Anesthesia Type:General  Level of Consciousness: awake, alert  and oriented  Airway and Oxygen Therapy: Patient Spontanous Breathing and Patient connected to nasal cannula oxygen  Post-op Pain: none  Post-op Assessment: Post-op Vital signs reviewed and Patient's Cardiovascular Status Stable  Post-op Vital Signs: Reviewed and stable  Complications: No apparent anesthesia complications

## 2012-06-14 NOTE — Transfer of Care (Signed)
Immediate Anesthesia Transfer of Care Note  Patient: Regina Fox  Procedure(s) Performed: Procedure(s): LAPAROSCOPY OPERATIVE (N/A)  Patient Location: PACU  Anesthesia Type:General  Level of Consciousness: awake, alert , oriented and patient cooperative  Airway & Oxygen Therapy: Patient Spontanous Breathing and Patient connected to nasal cannula oxygen  Post-op Assessment: Report given to PACU RN, Post -op Vital signs reviewed and stable and Patient moving all extremities X 4  Post vital signs: Reviewed and stable  Complications: No apparent anesthesia complications

## 2012-06-14 NOTE — Anesthesia Procedure Notes (Addendum)
Performed by: Marrion Coy R   Procedure Name: Intubation Date/Time: 06/14/2012 12:08 AM Performed by: Marrion Coy R Pre-anesthesia Checklist: Patient identified, Patient being monitored, Timeout performed, Emergency Drugs available and Suction available Patient Re-evaluated:Patient Re-evaluated prior to inductionOxygen Delivery Method: Circle system utilized Preoxygenation: Pre-oxygenation with 100% oxygen Intubation Type: IV induction, Rapid sequence and Cricoid Pressure applied Laryngoscope Size: Mac and 3 Grade View: Grade I Tube type: Oral Tube size: 7.0 mm Number of attempts: 1 Placement Confirmation: ETT inserted through vocal cords under direct vision,  positive ETCO2 and breath sounds checked- equal and bilateral Secured at: 22 cm Tube secured with: Tape

## 2012-06-17 ENCOUNTER — Encounter (HOSPITAL_COMMUNITY): Payer: Self-pay | Admitting: Obstetrics and Gynecology

## 2012-06-24 NOTE — Discharge Summary (Signed)
Physician Discharge Summary  Patient ID: Regina Fox MRN: [id number] DOB/AGE: [birth year] 30 y.o.  Admit date: 06/13/2012 Discharge date: 06/24/2012  Admission Diagnoses: 1) Ruptured Ectopic pregnancy  Discharge Diagnoses: 1) Ruptured Ectopic Pregnancy Active Problems:   * No active hospital problems. *   Discharged Condition: Improved  Hospital Course: L/S Left salpingectomy  Consults: None  Significant Diagnostic Studies: Pelvic ultrasound  Treatments: L/S left salpingectomy  Discharge Exam: Blood pressure 104/57, pulse 85, temperature 98.1 F (36.7 C), temperature source Oral, resp. rate 16, height 5\' 2"  (1.575 m), weight 69.128 kg (152 lb 6.4 oz), SpO2 99.00%. AOX3, NAD Abd soft, Appropriately tender Inc Healing well  Disposition: 01-Home or Self Care  Discharge Orders   Future Orders Complete By Expires     Call MD for:  difficulty breathing, headache or visual disturbances  As directed     Call MD for:  hives  As directed     Call MD for:  persistant nausea and vomiting  As directed     Call MD for:  redness, tenderness, or signs of infection (pain, swelling, redness, odor or green/yellow discharge around incision site)  As directed     Call MD for:  severe uncontrolled pain  As directed     Call MD for:  temperature >100.4  As directed     Diet - low sodium heart healthy  As directed     Discharge instructions  As directed     Comments:      Light activity with no heavy lifting greater than 20 pounds for the first week and don't lift greater than 40 pounds for the 2nd week.  Light vaginal bleeding is normal. Follow up in the office in 2-4 weeks    Discharge wound care:  As directed     Comments:      You may wash incision with soap and water.  Do not soak or submerge the incision for 2 weeks. You may shower and wash your incisions with soap and water. Keep incision dry. You may need to keep a sanitary pad or panty liner between the incision and your clothing for  comfort and to keep the incision dry. If you note drainage, increased pain, or increased redness of the incision, then please notify your physician.    Driving Restrictions  As directed     Comments:      Do not drive until you are not taking narcotic pain medication AND you can comfortably slam on the brakes.    Increase activity slowly  As directed     Sexual Activity Restrictions  As directed     Comments:      No sexual activity until after your evaluation in the office        Medication List    STOP taking these medications       acetaminophen 325 MG tablet  Commonly known as:  TYLENOL      TAKE these medications       Azelastine-Fluticasone 137-50 MCG/ACT Susp  Commonly known as:  DYMISTA  Place 1 spray into the nose 2 (two) times daily.     docusate sodium 100 MG capsule  Commonly known as:  COLACE  Take 1 capsule (100 mg total) by mouth 2 (two) times daily.     ibuprofen 600 MG tablet  Commonly known as:  ADVIL,MOTRIN  Take 1 tablet (600 mg total) by mouth every 6 (six) hours as needed for pain.     OPCON-A OP  Place 1 drop into both eyes daily as needed.     oxyCODONE-acetaminophen 5-325 MG per tablet  Commonly known as:  ROXICET  Take 2 tablets by mouth every 4 (four) hours as needed for pain. May take 1-2 tablets every 4-6 hours as needed for pain     prenatal multivitamin Tabs  Take 1 tablet by mouth daily at 12 noon.         SignedAlmon Hercules. 06/24/2012, 2:25 PM

## 2012-07-25 ENCOUNTER — Encounter: Payer: Self-pay | Admitting: Internal Medicine

## 2012-07-25 ENCOUNTER — Ambulatory Visit (INDEPENDENT_AMBULATORY_CARE_PROVIDER_SITE_OTHER): Payer: BC Managed Care – PPO | Admitting: Internal Medicine

## 2012-07-25 VITALS — BP 110/78 | HR 72 | Temp 98.0°F | Ht 62.0 in | Wt 151.2 lb

## 2012-07-25 DIAGNOSIS — J019 Acute sinusitis, unspecified: Secondary | ICD-10-CM

## 2012-07-25 DIAGNOSIS — J309 Allergic rhinitis, unspecified: Secondary | ICD-10-CM

## 2012-07-25 MED ORDER — LEVOFLOXACIN 250 MG PO TABS: 250.0000 mg | ORAL_TABLET | Freq: Every day | ORAL | Status: DC

## 2012-07-25 NOTE — Assessment & Plan Note (Signed)
Cont current tx - the dymista, or if too expensive for flonase after sample

## 2012-07-25 NOTE — Progress Notes (Signed)
  Subjective:    Patient ID: Regina Fox, female    DOB: Feb 25, 1980, 32 y.o.   MRN: [id number]  HPI   Here with 2-3 days acute onset fever, facial pain, pressure, headache, general weakness and malaise, and greenish d/c, with mild ST and cough, but pt denies chest pain, wheezing, increased sob or doe, orthopnea, PND, increased LE swelling, palpitations, dizziness or syncope. Dymista working well for allergies. Not pregnant, s/p surgury for   Past Medical History  Diagnosis Date  . URI 08/22/2007  . RASH-NONVESICULAR 10/01/2009  . Allergic rhinitis, cause unspecified 07/18/2010  . Vitamin D deficiency 02/15/2012   Past Surgical History  Procedure Laterality Date  . Denied surgical hx    . Ectopic pregnancy surgery    . Laparoscopy N/A 06/14/2012    Procedure: LAPAROSCOPY OPERATIVE;  Surgeon: Freddrick March. Tenny Craw, MD;  Location: [facility];  Service: Gynecology;  Laterality: N/A;    reports that she has never smoked. She does not have any smokeless tobacco history on file. She reports that she does not drink alcohol or use illicit drugs. family history is not on file. No Known Allergies Current Outpatient Prescriptions on File Prior to Visit  Medication Sig Dispense Refill  . Azelastine-Fluticasone (DYMISTA) 137-50 MCG/ACT SUSP Place 1 spray into the nose 2 (two) times daily.  23 g  5  . Prenatal Vit-Fe Fumarate-FA (PRENATAL MULTIVITAMIN) TABS Take 1 tablet by mouth daily at 12 noon.       No current facility-administered medications on file prior to visit.   Review of Systems All otherwise neg per pt     Objective:   Physical Exam BP 110/78  Pulse 72  Temp(Src) 98 F (36.7 C) (Oral)  Ht 5\' 2"  (1.575 m)  Wt 151 lb 4 oz (68.607 kg)  BMI 27.66 kg/m2  SpO2 95% VS noted, mild ill Constitutional: Pt appears well-developed and well-nourished.  HENT: Head: NCAT.  Right Ear: External ear normal.  Left Ear: External ear normal.  Bilat tm's with mild erythema.  Max sinus areas mild tender.   Pharynx with mild erythema, no exudate Eyes: Conjunctivae and EOM are normal. Pupils are equal, round, and reactive to light.  Neck: Normal range of motion. Neck supple.  Cardiovascular: Normal rate and regular rhythm.   Pulmonary/Chest: Effort normal and breath sounds normal.  Neurological: Pt is alert. Not confused  Skin: Skin is warm. No erythema.  Psychiatric: Pt behavior is normal. Thought content normal.     Assessment & Plan:

## 2012-07-25 NOTE — Assessment & Plan Note (Signed)
Mild to mod, for antibx course,  to f/u any worsening symptoms or concerns 

## 2012-07-25 NOTE — Patient Instructions (Signed)
Please take all new medication as prescribed Please continue all other medications as before You can also take Delsym OTC for cough, and/or Mucinex (or it's generic off brand) for congestion, and tylenol as needed for pain.  Please remember to sign up for My Chart if you have not done so, as this will be important to you in the future with finding out test results, communicating by private email, and scheduling acute appointments online when needed.

## 2012-09-19 IMAGING — CR DG HAND 2V*L*
2 series · 2 of 2 positions shown · non-contrast
Comparison: None

CLINICAL DATA: status post fall.  Left hand injury.

LEFT HAND - 2 VIEW

[view not recorded (1 of 2)]
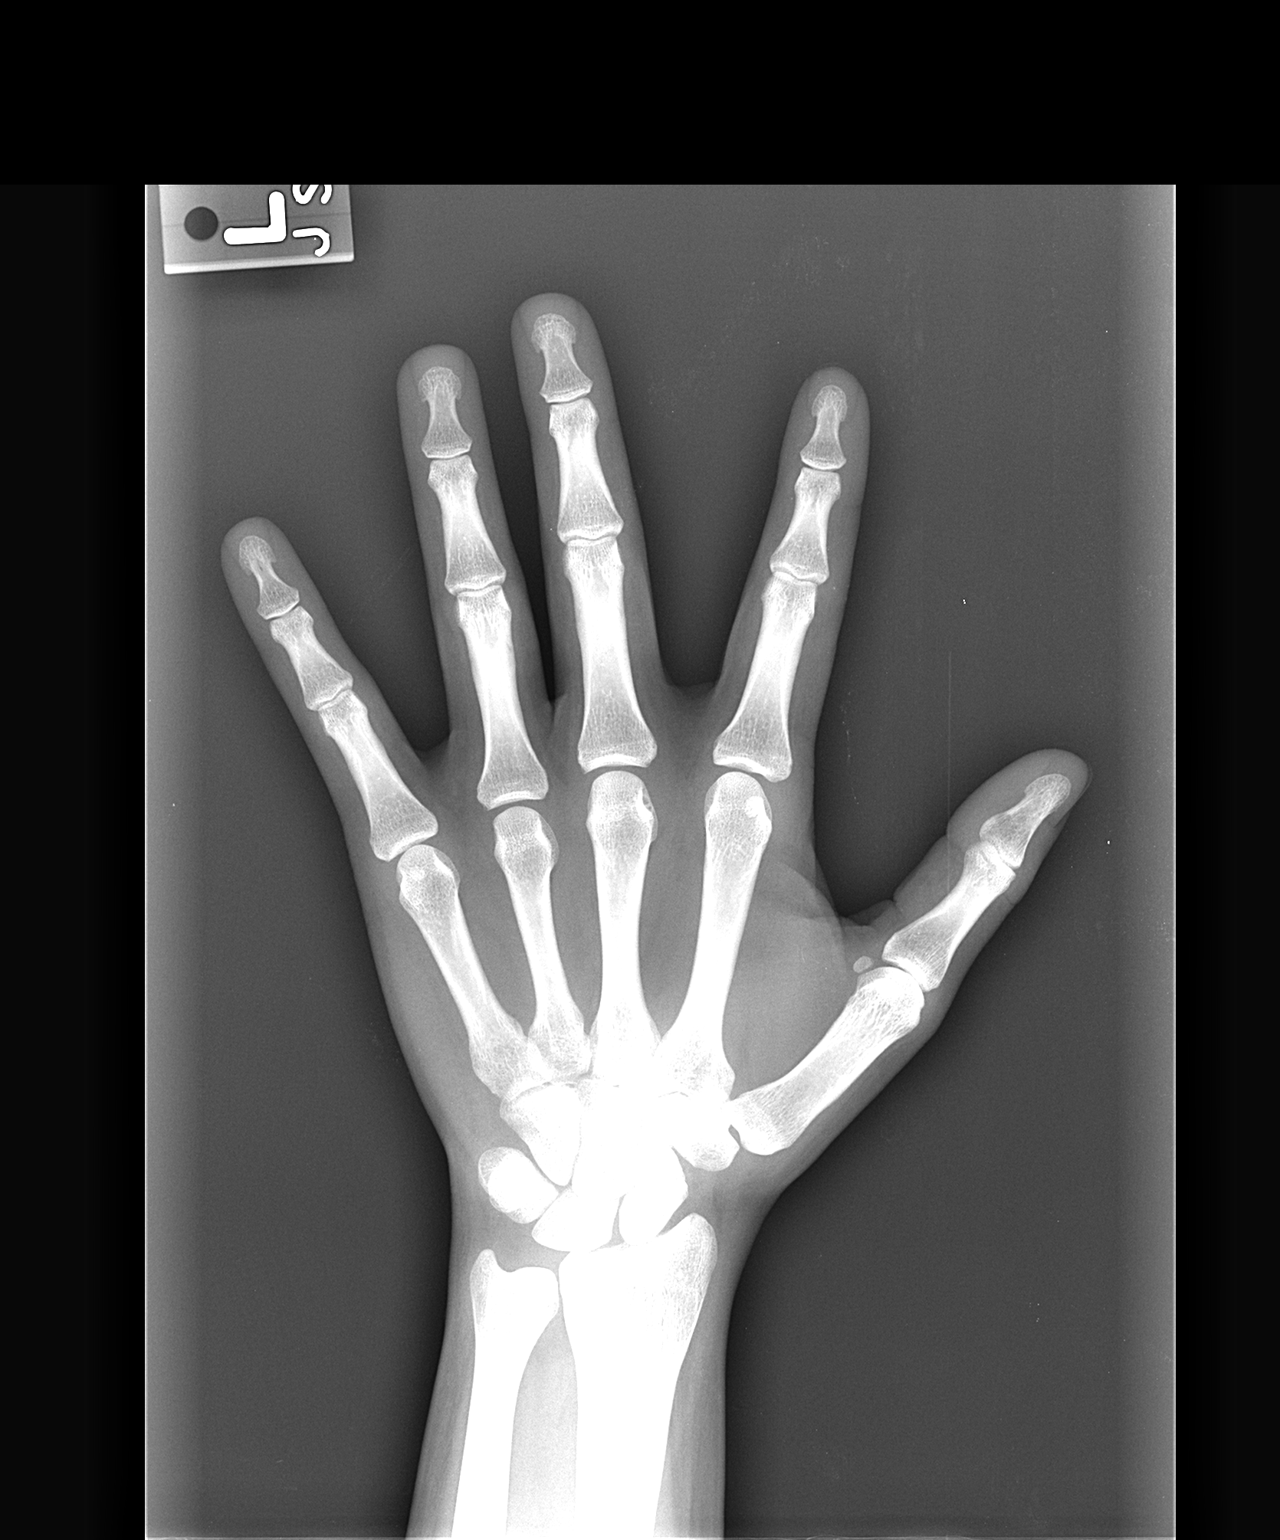

[view not recorded (2 of 2)]
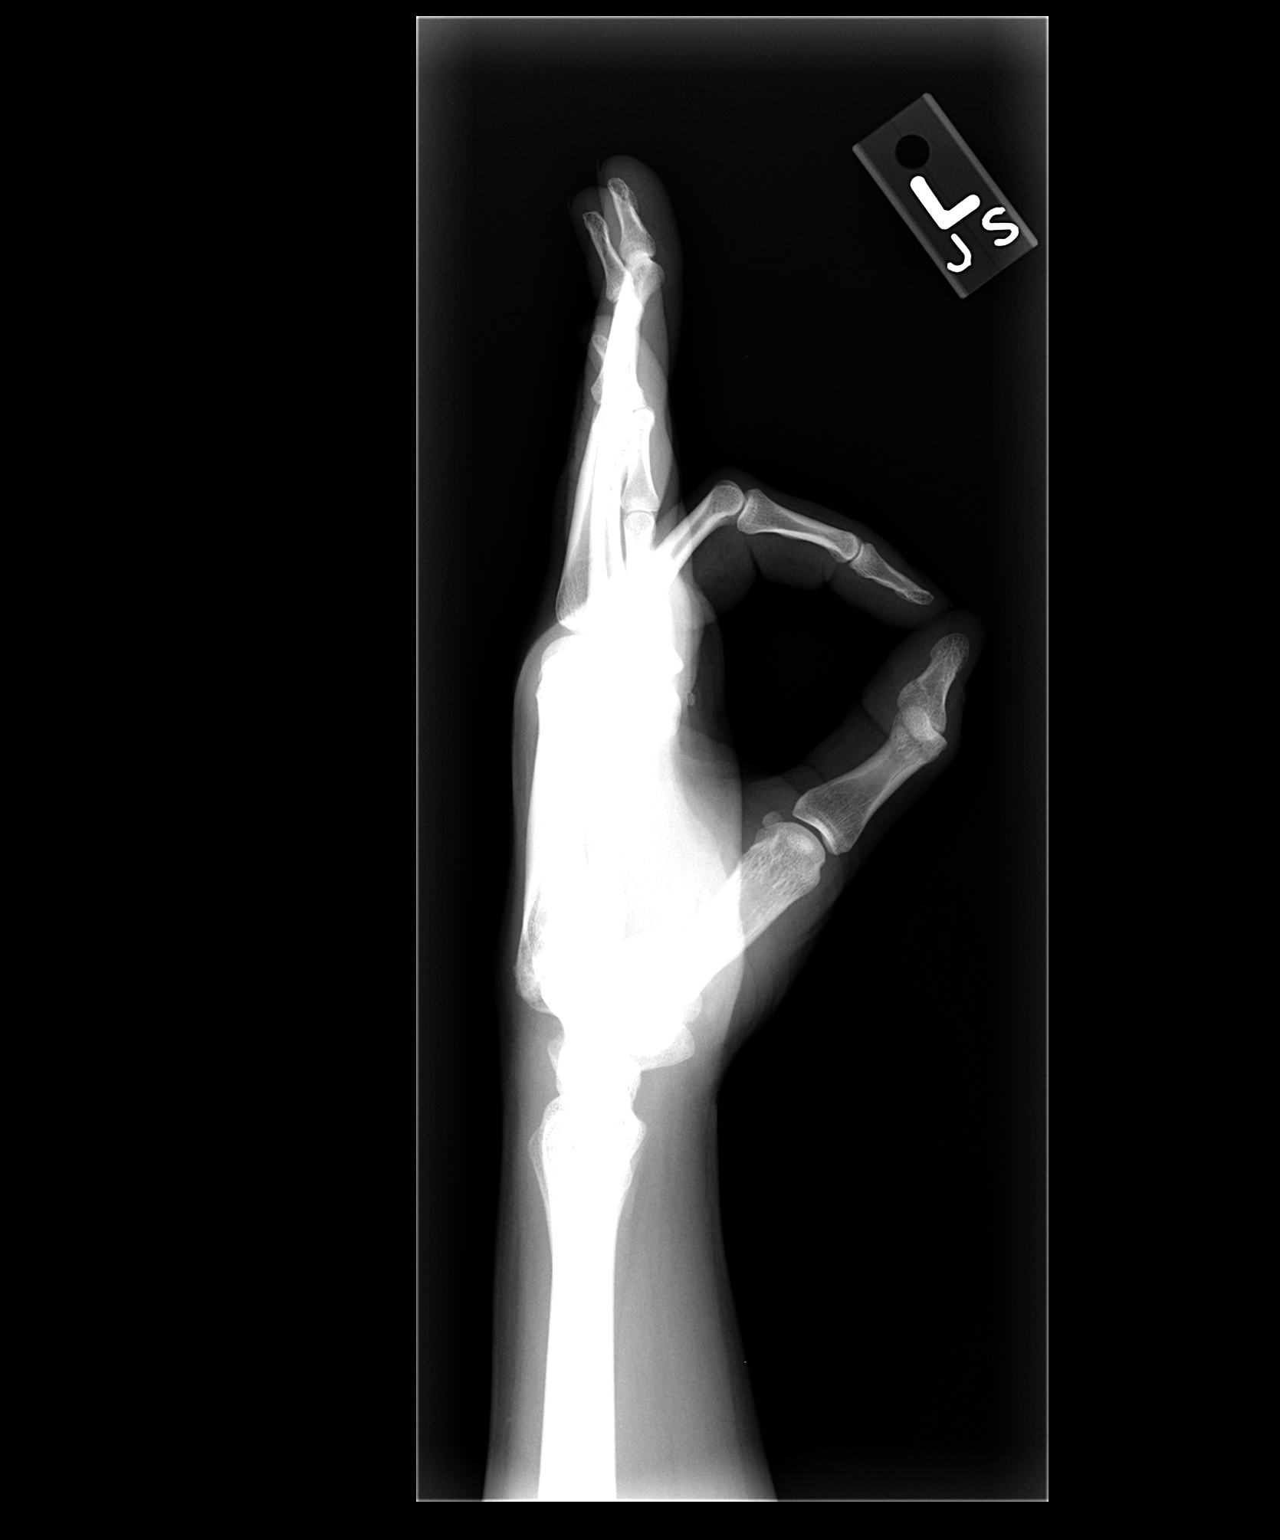

[2 of 2 positions shown; findings below may reference images not displayed]

FINDINGS: There is no evidence of fracture or dislocation.  There
is no evidence of arthropathy or other focal bone abnormality.
Soft tissues are unremarkable.
IMPRESSION: Negative exam.

## 2012-11-21 ENCOUNTER — Other Ambulatory Visit: Payer: Self-pay

## 2012-11-21 LAB — OB RESULTS CONSOLE ANTIBODY SCREEN: Antibody Screen: NEGATIVE

## 2012-11-21 LAB — OB RESULTS CONSOLE RPR: RPR: NONREACTIVE

## 2012-11-21 LAB — OB RESULTS CONSOLE RUBELLA ANTIBODY, IGM: Rubella: IMMUNE

## 2012-11-21 LAB — OB RESULTS CONSOLE ABO/RH: RH Type: POSITIVE

## 2012-11-21 LAB — OB RESULTS CONSOLE HIV ANTIBODY (ROUTINE TESTING): HIV: NONREACTIVE

## 2012-11-21 LAB — OB RESULTS CONSOLE HEPATITIS B SURFACE ANTIGEN: Hepatitis B Surface Ag: NEGATIVE

## 2013-01-16 NOTE — L&D Delivery Note (Signed)
Delivery Note At 4:33 AM a viable and healthy female was delivered via Vaginal, Spontaneous Delivery (Presentation: Left ; Occiput Anterior).  APGAR: 9, 9; weight pending.   Placenta status: Intact, Spontaneous.  Cord: 3 vessels with a loose nuchal cord that was reduced after delivery  Anesthesia:  None Episiotomy: None Lacerations: None Suture Repair: NA Est. Blood Loss (mL): 250 cc  Mom to postpartum.  Baby to Couplet care / Skin to Skin.  Regina Fox. Regina Fox 06/14/2013, 4:47 AM

## 2013-05-22 LAB — OB RESULTS CONSOLE GBS: GBS: NEGATIVE

## 2013-06-14 ENCOUNTER — Inpatient Hospital Stay (HOSPITAL_COMMUNITY)
Admission: AD | Admit: 2013-06-14 | Discharge: 2013-06-15 | DRG: 775 | Disposition: A | Discharge status: Home / Self Care | Payer: BC Managed Care – PPO | Source: Ambulatory Visit | Attending: Obstetrics and Gynecology | Admitting: Obstetrics and Gynecology

## 2013-06-14 ENCOUNTER — Encounter (HOSPITAL_COMMUNITY): Payer: Self-pay

## 2013-06-14 DIAGNOSIS — Z3490 Encounter for supervision of normal pregnancy, unspecified, unspecified trimester: Secondary | ICD-10-CM

## 2013-06-14 LAB — TYPE AND SCREEN
ABO/RH(D): B POS
Antibody Screen: NEGATIVE

## 2013-06-14 LAB — CBC
HCT: 38.8 % (ref 36.0–46.0)
Hemoglobin: 13.4 g/dL (ref 12.0–15.0)
MCH: 31.4 pg (ref 26.0–34.0)
MCHC: 34.5 g/dL (ref 30.0–36.0)
MCV: 90.9 fL (ref 78.0–100.0)
Platelets: 178 10*3/uL (ref 150–400)
RBC: 4.27 MIL/uL (ref 3.87–5.11)
RDW: 13 % (ref 11.5–15.5)
WBC: 9.1 10*3/uL (ref 4.0–10.5)

## 2013-06-14 LAB — RPR

## 2013-06-14 MED ORDER — EPHEDRINE 5 MG/ML INJ
10.0000 mg | INTRAVENOUS | Status: DC | PRN
  Filled 2013-06-14: qty 2

## 2013-06-14 MED ORDER — FENTANYL 2.5 MCG/ML BUPIVACAINE 1/10 % EPIDURAL INFUSION (WH - ANES)
INTRAMUSCULAR | Status: AC
  Filled 2013-06-14: qty 125

## 2013-06-14 MED ORDER — PRENATAL MULTIVITAMIN CH
1.0000 | ORAL_TABLET | Freq: Every day | ORAL | Status: DC
  Administered 2013-06-14 – 2013-06-15 (×2): 1 via ORAL
  Filled 2013-06-14 (×2): qty 1

## 2013-06-14 MED ORDER — METHYLERGONOVINE MALEATE 0.2 MG PO TABS: 0.2000 mg | ORAL_TABLET | ORAL | Status: DC | PRN

## 2013-06-14 MED ORDER — LACTATED RINGERS IV SOLN: 500.0000 mL | Freq: Once | INTRAVENOUS | Status: DC

## 2013-06-14 MED ORDER — CITRIC ACID-SODIUM CITRATE 334-500 MG/5ML PO SOLN: 30.0000 mL | ORAL | Status: DC | PRN

## 2013-06-14 MED ORDER — LIDOCAINE HCL (PF) 1 % IJ SOLN
30.0000 mL | INTRAMUSCULAR | Status: DC | PRN
  Filled 2013-06-14: qty 30

## 2013-06-14 MED ORDER — IBUPROFEN 600 MG PO TABS
600.0000 mg | ORAL_TABLET | Freq: Four times a day (QID) | ORAL | Status: DC
  Administered 2013-06-14 – 2013-06-15 (×6): 600 mg via ORAL
  Filled 2013-06-14 (×6): qty 1

## 2013-06-14 MED ORDER — DIPHENHYDRAMINE HCL 25 MG PO CAPS: 25.0000 mg | ORAL_CAPSULE | Freq: Four times a day (QID) | ORAL | Status: DC | PRN

## 2013-06-14 MED ORDER — BENZOCAINE-MENTHOL 20-0.5 % EX AERO
1.0000 "application " | INHALATION_SPRAY | CUTANEOUS | Status: DC | PRN
  Administered 2013-06-14: 1 via TOPICAL
  Filled 2013-06-14: qty 56

## 2013-06-14 MED ORDER — IBUPROFEN 600 MG PO TABS
600.0000 mg | ORAL_TABLET | Freq: Four times a day (QID) | ORAL | Status: DC | PRN
Start: 2013-06-14 — End: 2013-06-14

## 2013-06-14 MED ORDER — TETANUS-DIPHTH-ACELL PERTUSSIS 5-2.5-18.5 LF-MCG/0.5 IM SUSP
0.5000 mL | Freq: Once | INTRAMUSCULAR | Status: AC
  Administered 2013-06-15: 0.5 mL via INTRAMUSCULAR
  Filled 2013-06-14: qty 0.5

## 2013-06-14 MED ORDER — LANOLIN HYDROUS EX OINT: TOPICAL_OINTMENT | CUTANEOUS | Status: DC | PRN

## 2013-06-14 MED ORDER — OXYCODONE-ACETAMINOPHEN 5-325 MG PO TABS: 1.0000 | ORAL_TABLET | ORAL | Status: DC | PRN

## 2013-06-14 MED ORDER — SIMETHICONE 80 MG PO CHEW: 80.0000 mg | CHEWABLE_TABLET | ORAL | Status: DC | PRN

## 2013-06-14 MED ORDER — ONDANSETRON HCL 4 MG PO TABS: 4.0000 mg | ORAL_TABLET | ORAL | Status: DC | PRN

## 2013-06-14 MED ORDER — FLEET ENEMA 7-19 GM/118ML RE ENEM: 1.0000 | ENEMA | RECTAL | Status: DC | PRN

## 2013-06-14 MED ORDER — PHENYLEPHRINE 40 MCG/ML (10ML) SYRINGE FOR IV PUSH (FOR BLOOD PRESSURE SUPPORT)
80.0000 ug | PREFILLED_SYRINGE | INTRAVENOUS | Status: DC | PRN
  Filled 2013-06-14: qty 2

## 2013-06-14 MED ORDER — SENNOSIDES-DOCUSATE SODIUM 8.6-50 MG PO TABS
2.0000 | ORAL_TABLET | ORAL | Status: DC
  Administered 2013-06-14: 2 via ORAL
  Filled 2013-06-14: qty 2

## 2013-06-14 MED ORDER — DIPHENHYDRAMINE HCL 50 MG/ML IJ SOLN: 12.5000 mg | INTRAMUSCULAR | Status: DC | PRN

## 2013-06-14 MED ORDER — PHENYLEPHRINE 40 MCG/ML (10ML) SYRINGE FOR IV PUSH (FOR BLOOD PRESSURE SUPPORT)
PREFILLED_SYRINGE | INTRAVENOUS | Status: AC
  Filled 2013-06-14: qty 10

## 2013-06-14 MED ORDER — EPHEDRINE 5 MG/ML INJ
INTRAVENOUS | Status: AC
  Filled 2013-06-14: qty 4

## 2013-06-14 MED ORDER — FENTANYL 2.5 MCG/ML BUPIVACAINE 1/10 % EPIDURAL INFUSION (WH - ANES): 14.0000 mL/h | INTRAMUSCULAR | Status: DC | PRN

## 2013-06-14 MED ORDER — ACETAMINOPHEN 325 MG PO TABS: 650.0000 mg | ORAL_TABLET | ORAL | Status: DC | PRN

## 2013-06-14 MED ORDER — METHYLERGONOVINE MALEATE 0.2 MG/ML IJ SOLN: 0.2000 mg | INTRAMUSCULAR | Status: DC | PRN

## 2013-06-14 MED ORDER — LACTATED RINGERS IV SOLN
INTRAVENOUS | Status: DC
  Administered 2013-06-14: 04:00:00 via INTRAVENOUS

## 2013-06-14 MED ORDER — ONDANSETRON HCL 4 MG/2ML IJ SOLN: 4.0000 mg | Freq: Four times a day (QID) | INTRAMUSCULAR | Status: DC | PRN

## 2013-06-14 MED ORDER — OXYTOCIN BOLUS FROM INFUSION
500.0000 mL | INTRAVENOUS | Status: DC
  Administered 2013-06-14: 500 mL via INTRAVENOUS

## 2013-06-14 MED ORDER — ZOLPIDEM TARTRATE 5 MG PO TABS: 5.0000 mg | ORAL_TABLET | Freq: Every evening | ORAL | Status: DC | PRN

## 2013-06-14 MED ORDER — DIBUCAINE 1 % RE OINT: 1.0000 "application " | TOPICAL_OINTMENT | RECTAL | Status: DC | PRN

## 2013-06-14 MED ORDER — LACTATED RINGERS IV SOLN: 500.0000 mL | INTRAVENOUS | Status: DC | PRN

## 2013-06-14 MED ORDER — WITCH HAZEL-GLYCERIN EX PADS: 1.0000 "application " | MEDICATED_PAD | CUTANEOUS | Status: DC | PRN

## 2013-06-14 MED ORDER — ONDANSETRON HCL 4 MG/2ML IJ SOLN: 4.0000 mg | INTRAMUSCULAR | Status: DC | PRN

## 2013-06-14 MED ORDER — OXYTOCIN 40 UNITS IN LACTATED RINGERS INFUSION - SIMPLE MED
62.5000 mL/h | INTRAVENOUS | Status: DC
  Filled 2013-06-14: qty 1000

## 2013-06-14 NOTE — MAU Note (Signed)
Water broke at 0230, clear fluid.  Contractions every 2 min.  Was 4 cm. No bleeding.

## 2013-06-14 NOTE — Progress Notes (Signed)
Post Partum Day 0 Subjective: no complaints, up ad lib, voiding and tolerating PO  Objective: Blood pressure 107/68, pulse 66, temperature 98.1 F (36.7 C), temperature source Oral, resp. rate 18, height 5\' 2"  (1.575 m), weight 89.086 kg (196 lb 6.4 oz), SpO2 98.00%, unknown if currently breastfeeding.  Physical Exam:  General: alert, cooperative and appears stated age Lochia: appropriate Uterine Fundus: firm   Recent Labs  06/14/13 0410  HGB 13.4  HCT 38.8    Assessment/Plan: Breastfeeding Desires Circ, R/B/A reviewed. Will proceed likely tomorrow   LOS: 0 days   Sarahy Creedon H. Malin Cervini 06/14/2013, 11:14 AM

## 2013-06-14 NOTE — H&P (Signed)
Regina Fox is a 33 y.o. female presenting for leaking fluid  33 yo [id number] @ 39+6 presents for leaking fluid and was confirmed to have broken her water.  Pt was found to be 7-8 cm dilated. Pt quickly progressed to complete and was unable to get an epidural. History OB History   Grav Para Term Preterm Abortions TAB SAB Ect Mult Living   6 2 2  2  1 1  2      Past Medical History  Diagnosis Date  . URI 08/22/2007  . RASH-NONVESICULAR 10/01/2009  . Allergic rhinitis, cause unspecified 07/18/2010  . Vitamin D deficiency 02/15/2012   Past Surgical History  Procedure Laterality Date  . Denied surgical hx    . Ectopic pregnancy surgery    . Laparoscopy N/A 06/14/2012    Procedure: LAPAROSCOPY OPERATIVE;  Surgeon: Regina March. Tenny Craw, MD;  Location: [facility];  Service: Gynecology;  Laterality: N/A;   Family History: family history is not on file. Social History:  reports that she has never smoked. She does not have any smokeless tobacco history on file. She reports that she does not drink alcohol or use illicit drugs.   Prenatal Transfer Tool  Maternal Diabetes: No Genetic Screening: Normal Maternal Ultrasounds/Referrals: Normal Fetal Ultrasounds or other Referrals:  None Maternal Substance Abuse:  No Significant Maternal Medications:  None Significant Maternal Lab Results:  None Other Comments:  None  ROS: as above  Dilation: 10 Effacement (%): 100 Station: +2 Exam by:: Dr. Tenny Fox Blood pressure 124/74, pulse 90, temperature 98.1 F (36.7 C), temperature source Oral, resp. rate 18, height 5\' 2"  (1.575 m), weight 89.086 kg (196 lb 6.4 oz), SpO2 100.00%. Exam Physical Exam  Prenatal labs: ABO, Rh: B/Positive/-- (11/06 0000) Antibody: Negative (11/06 0000) Rubella: Immune (11/06 0000) RPR: Nonreactive (11/06 0000)  HBsAg: Negative (11/06 0000)  HIV: Non-reactive (11/06 0000)  GBS: Negative (05/07 0000)   Assessment/Plan: 1) Admit 2) Anticipate SVD   Regina Fox 06/14/2013, 4:49  AM

## 2013-06-14 NOTE — Lactation Note (Signed)
This note was copied from the chart of Regina Fox. Lactation Consultation Note  Patient Name: Regina Fox [id number] Date: 06/14/2013 Reason for consult: Initial assessment Assisted Mom with latching baby in side lying position. Mom is experienced BF but her last time BF was 5 years ago. BF basics reviewed with Mom. Lactation brochure left for review, advised of OP services and support group. Encouraged to call for assist as needed.   Maternal Data Formula Feeding for Exclusion: No Infant to breast within first hour of birth: Yes Has patient been taught Hand Expression?: Yes Does the patient have breastfeeding experience prior to this delivery?: Yes  Feeding Feeding Type: Breast Fed Length of feed: 0 min  LATCH Score/Interventions Latch: Grasps breast easily, tongue down, lips flanged, rhythmical sucking.  Audible Swallowing: A few with stimulation  Type of Nipple: Everted at rest and after stimulation  Comfort (Breast/Nipple): Soft / non-tender     Hold (Positioning): Assistance needed to correctly position infant at breast and maintain latch.  LATCH Score: 8  Lactation Tools Discussed/Used WIC Program: No   Consult Status Consult Status: Follow-up Date: 06/15/13 Follow-up type: In-patient    Alfred Levins 06/14/2013, 8:53 PM

## 2013-06-15 LAB — CBC
HCT: 36.1 % (ref 36.0–46.0)
HEMOGLOBIN: 12.2 g/dL (ref 12.0–15.0)
MCH: 31.4 pg (ref 26.0–34.0)
MCHC: 33.8 g/dL (ref 30.0–36.0)
MCV: 92.8 fL (ref 78.0–100.0)
Platelets: 133 10*3/uL — ABNORMAL LOW (ref 150–400)
RBC: 3.89 MIL/uL (ref 3.87–5.11)
RDW: 13.2 % (ref 11.5–15.5)
WBC: 7.9 10*3/uL (ref 4.0–10.5)

## 2013-06-15 MED ORDER — OXYCODONE-ACETAMINOPHEN 5-325 MG PO TABS: 2.0000 | ORAL_TABLET | ORAL | Status: DC | PRN

## 2013-06-15 MED ORDER — DOCUSATE SODIUM 100 MG PO CAPS
100.0000 mg | ORAL_CAPSULE | Freq: Two times a day (BID) | ORAL | Status: DC
Start: 2013-06-15 — End: 2014-02-04

## 2013-06-15 MED ORDER — IBUPROFEN 600 MG PO TABS: 600.0000 mg | ORAL_TABLET | Freq: Four times a day (QID) | ORAL | Status: DC | PRN

## 2013-06-15 NOTE — Discharge Summary (Signed)
Obstetric Discharge Summary Reason for Admission: onset of labor Prenatal Procedures: ultrasound Intrapartum Procedures: spontaneous vaginal delivery Postpartum Procedures: none Complications-Operative and Postpartum: none Hemoglobin  Date Value Ref Range Status  06/15/2013 12.2  12.0 - 15.0 g/dL Final     HCT  Date Value Ref Range Status  06/15/2013 36.1  36.0 - 46.0 % Final    Physical Exam:  General: alert, cooperative and appears stated age 33: appropriate Uterine Fundus: firm  Discharge Diagnoses: Term Pregnancy-delivered  Discharge Information: Date: 06/15/2013 Activity: pelvic rest Diet: routine Medications: Ibuprofen, Colace and Percocet Condition: improved Instructions: refer to practice specific booklet Discharge to: home Follow-up Information   Follow up with Almon Hercules., MD In 4 weeks. (for a post-partum evaluation)    Specialty:  Obstetrics and Gynecology   Contact information:   [address] [place] [address] [phone]       Newborn Data: Live born female  Birth Weight: 6 lb 13.2 oz (3096 g) APGAR: 9, 10  Home with mother.  Regina Fox. Regina Fox 06/15/2013, 12:58 PM

## 2013-11-17 ENCOUNTER — Encounter (HOSPITAL_COMMUNITY): Payer: Self-pay

## 2014-02-04 ENCOUNTER — Ambulatory Visit (INDEPENDENT_AMBULATORY_CARE_PROVIDER_SITE_OTHER): Payer: BC Managed Care – PPO | Admitting: Internal Medicine

## 2014-02-04 ENCOUNTER — Encounter: Payer: Self-pay | Admitting: Internal Medicine

## 2014-02-04 VITALS — BP 100/76 | HR 79 | Temp 97.5°F | Resp 12 | Ht 62.0 in | Wt 169.0 lb

## 2014-02-04 DIAGNOSIS — J0100 Acute maxillary sinusitis, unspecified: Secondary | ICD-10-CM

## 2014-02-04 MED ORDER — AMOXICILLIN 500 MG PO CAPS: 500.0000 mg | ORAL_CAPSULE | Freq: Three times a day (TID) | ORAL | Status: DC

## 2014-02-04 NOTE — Progress Notes (Signed)
   Subjective:    Patient ID: Regina FurnishSuheer Shore, female    DOB: 12/22/1980, 34 y.o.   MRN: [id number]  HPI   Symptoms began 01/31/14 as chills, headache, and sore throat. Cough began 1/19; this been nonproductive but keeps her awake at night.  She has positive symptoms of congestion, obstruction, frontal headache, right ear pain, and dental pain. She has been sneezing.  Significantly she is breast-feeding.  She has 3 children 32 years old, 32 years old and 7 months at home.  Review of Systems She denies any nasal purulence. Secretions have been clear. She also denies facial pain, fever, otic discharge.  She has no extrinsic symptoms of itchy, watery eyes.    Objective:   Physical Exam  Pertinent or positive findings include: The right nare is completely obstructed by septal deviation and erythematous, edematous mucosa. There is septal dislocation to the left. Tympanic membranes are slightly dull   General appearance:good health ;well nourished; no acute distress or increased work of breathing is present.  No  lymphadenopathy about the head, neck, or axilla noted.  Eyes: No conjunctival inflammation or lid edema is present. There is no scleral icterus. Ears:  External ear exam shows no significant lesions or deformities.  Otoscopic examination reveals clear canals, tympanic membranes are intact bilaterally without bulging, retraction, inflammation or discharge. Nose:  External nasal examination shows no deformity or inflammation.  Oral exam: Dental hygiene is good; lips and gums are healthy appearing.There is no oropharyngeal erythema or exudate noted.  Neck:  No deformities, thyromegaly, masses, or tenderness noted.   Supple with full range of motion without pain.  Heart:  Normal rate and regular rhythm. S1 and S2 normal without gallop, murmur, click, rub or other extra sounds.  Lungs:Chest clear to auscultation; no wheezes, rhonchi,rales ,or rubs present.No increased work of breathing.     Extremities:  No cyanosis, edema, or clubbing  noted  Skin: Warm & dry w/o  tenting.      Assessment & Plan:  #1 maxillary sinusitis  #2 septal deviation with nasal obstruction  #3 breast-feeding  Plan: See orders recommendations

## 2014-02-04 NOTE — Progress Notes (Signed)
Pre visit review using our clinic review tool, if applicable. No additional management support is needed unless otherwise documented below in the visit note. 

## 2014-02-04 NOTE — Patient Instructions (Signed)
Plain Mucinex DM as needed for cough ;force NON dairy fluids .   Nasal cleansing in the shower as discussed with lather of mild shampoo.After 10 seconds wash off lather while  exhaling through nostrils. Make sure that all residual soap is removed to prevent irritation.  Flonase OR Nasacort AQ 1 spray in each nostril twice a day as needed. Use the "crossover" technique into opposite nostril spraying toward opposite ear @ 45 degree angle, not straight up into nostril.

## 2014-03-05 ENCOUNTER — Other Ambulatory Visit: Payer: Self-pay | Admitting: Internal Medicine

## 2015-05-05 ENCOUNTER — Other Ambulatory Visit: Payer: Self-pay | Admitting: Internal Medicine

## 2016-02-11 ENCOUNTER — Other Ambulatory Visit (HOSPITAL_COMMUNITY): Payer: Self-pay | Admitting: Obstetrics and Gynecology

## 2016-02-11 DIAGNOSIS — Z3A20 20 weeks gestation of pregnancy: Secondary | ICD-10-CM

## 2016-02-11 DIAGNOSIS — O283 Abnormal ultrasonic finding on antenatal screening of mother: Secondary | ICD-10-CM

## 2016-02-11 DIAGNOSIS — Z3689 Encounter for other specified antenatal screening: Secondary | ICD-10-CM

## 2016-02-14 ENCOUNTER — Encounter (HOSPITAL_COMMUNITY): Payer: Self-pay | Admitting: *Deleted

## 2016-02-15 ENCOUNTER — Encounter (HOSPITAL_COMMUNITY): Payer: Self-pay

## 2016-02-15 ENCOUNTER — Ambulatory Visit (HOSPITAL_COMMUNITY)
Admission: RE | Admit: 2016-02-15 | Discharge: 2016-02-15 | Disposition: A | Discharge status: Home / Self Care | Payer: BC Managed Care – PPO | Source: Ambulatory Visit | Attending: Obstetrics and Gynecology | Admitting: Obstetrics and Gynecology

## 2016-02-15 ENCOUNTER — Ambulatory Visit (HOSPITAL_COMMUNITY): Admission: RE | Admit: 2016-02-15 | Payer: BC Managed Care – PPO | Source: Ambulatory Visit

## 2016-02-15 ENCOUNTER — Other Ambulatory Visit (HOSPITAL_COMMUNITY): Payer: Self-pay | Admitting: Obstetrics and Gynecology

## 2016-02-15 DIAGNOSIS — O99212 Obesity complicating pregnancy, second trimester: Secondary | ICD-10-CM | POA: Insufficient documentation

## 2016-02-15 DIAGNOSIS — O359XX0 Maternal care for (suspected) fetal abnormality and damage, unspecified, not applicable or unspecified: Secondary | ICD-10-CM | POA: Diagnosis not present

## 2016-02-15 DIAGNOSIS — Z3A20 20 weeks gestation of pregnancy: Secondary | ICD-10-CM | POA: Diagnosis not present

## 2016-02-15 DIAGNOSIS — Z363 Encounter for antenatal screening for malformations: Secondary | ICD-10-CM | POA: Diagnosis present

## 2016-02-15 DIAGNOSIS — Z6832 Body mass index (BMI) 32.0-32.9, adult: Secondary | ICD-10-CM | POA: Insufficient documentation

## 2016-02-15 DIAGNOSIS — O283 Abnormal ultrasonic finding on antenatal screening of mother: Secondary | ICD-10-CM

## 2016-02-15 DIAGNOSIS — Z3689 Encounter for other specified antenatal screening: Secondary | ICD-10-CM

## 2016-02-16 ENCOUNTER — Other Ambulatory Visit (HOSPITAL_COMMUNITY): Payer: Self-pay | Admitting: *Deleted

## 2016-02-16 DIAGNOSIS — Q249 Congenital malformation of heart, unspecified: Secondary | ICD-10-CM

## 2016-02-22 ENCOUNTER — Other Ambulatory Visit (HOSPITAL_COMMUNITY): Payer: Self-pay

## 2016-02-22 ENCOUNTER — Encounter (HOSPITAL_COMMUNITY): Payer: Self-pay

## 2016-03-15 ENCOUNTER — Encounter (HOSPITAL_COMMUNITY): Payer: Self-pay

## 2016-03-15 ENCOUNTER — Ambulatory Visit (HOSPITAL_COMMUNITY): Payer: BC Managed Care – PPO

## 2016-03-17 ENCOUNTER — Encounter (HOSPITAL_COMMUNITY): Payer: Self-pay

## 2016-03-21 ENCOUNTER — Encounter (HOSPITAL_COMMUNITY): Payer: Self-pay

## 2016-03-21 ENCOUNTER — Ambulatory Visit (HOSPITAL_COMMUNITY)
Admission: RE | Admit: 2016-03-21 | Discharge: 2016-03-21 | Disposition: A | Discharge status: Home / Self Care | Payer: BC Managed Care – PPO | Source: Ambulatory Visit | Attending: Obstetrics and Gynecology | Admitting: Obstetrics and Gynecology

## 2016-03-21 ENCOUNTER — Other Ambulatory Visit (HOSPITAL_COMMUNITY): Payer: Self-pay | Admitting: *Deleted

## 2016-03-21 DIAGNOSIS — O359XX0 Maternal care for (suspected) fetal abnormality and damage, unspecified, not applicable or unspecified: Secondary | ICD-10-CM

## 2016-03-21 DIAGNOSIS — Q249 Congenital malformation of heart, unspecified: Secondary | ICD-10-CM

## 2016-03-21 DIAGNOSIS — Z3A25 25 weeks gestation of pregnancy: Secondary | ICD-10-CM | POA: Diagnosis not present

## 2016-03-21 DIAGNOSIS — O358XX0 Maternal care for other (suspected) fetal abnormality and damage, not applicable or unspecified: Secondary | ICD-10-CM | POA: Diagnosis not present

## 2016-03-21 DIAGNOSIS — Z315 Encounter for genetic counseling: Secondary | ICD-10-CM | POA: Insufficient documentation

## 2016-03-21 DIAGNOSIS — Z3683 Encounter for fetal screening for congenital cardiac abnormalities: Secondary | ICD-10-CM | POA: Diagnosis not present

## 2016-03-21 NOTE — Progress Notes (Signed)
Genetic Counseling  High-Risk Gestation Note  Appointment Date:  03/21/2016 Referred By: Vanessa Kick, MD Date of Birth:  10-Aug-1980 Partner:  Beatris Ship   Pregnancy History: [id number] Estimated Date of Delivery: 07/02/16 Estimated Gestational Age: 26w2dAttending: MRenella Cunas MD   I met with Ms. Regina Fox engineer, contractingfor genetic counseling because of the ultrasound finding of fetal heart defect.   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 and fetal echocardiogram (3/02)  Ultrasound today visualized polyhydramnios  Reviewed results of previous screening  NIPS for aneuploidy ([facility]) with 22q11 deletion analysis- within normal limits  Reviewed options for additional screening  NIPS for single gene conditions ([facility])- declined today  [facility]  Ongoing ultrasound- follow-up scheduled in 4 weeks  Reviewed options for diagnostic testing, including risks, benefits, limitations and alternatives- declined amniocentesis  Reviewed other explanations for ultrasound findings  Reviewed family history concerns  We began by reviewing the ultrasound in detail. Ms. 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 [facility], and the visualized fetal heart defect at that time was reported to be most consistent with truncus arteriosus, with tetralogy of fallot on the list of differential diagnoses. Previous ultrasound and fetal echocardiogram reports under separate cover. Follow-up ultrasound was performed today. Polyhydramnios was visualized. Complete ultrasound report  under separate cover. The fetal echocardiogram report states discussing with the patient making a [facility] [redacted[facility]   Ms. OClaroswas counseled that congenital heart defects occur at a frequency of approximately 1 in 100 births.  We discussed that truncus arteriosus is a rare congenital heart defect in which there is one great vessel coming off of the heart, rather than the typical two.  In addition, fetuses with truncus arteriosus also have a ventricular septal defect.  We discussed that congenital heart defects can be genetic, environmental, or multifactorial in etiology.  Ms. OCassarawas counseled that congenital heart defects are prominent features in chromosome conditions, particularly autosomal trisomies, Turner syndrome, and microdeletions.  Approximately 15-20% of congenital heart defects result from an underlying chromosome condition.  In addition, single gene conditions are also a common genetic cause of congenital heart defects.  To date, more than 500 single gene disorders are known to have congenital heart disease as a described feature.  We reviewed chromosomes, nondisjunction, and the common features of Down syndrome, trisomies 13/18, and 22q11 deletion syndrome.  We also briefly reviewed specific single gene inheritance patterns and associated risks of recurrence.    Environmental causes for congenital heart defects are well described and many teratogens are known to be linked to congenital heart disease (alcohol and specific medications).  Additionally, we discussed that the incidence of congenital heart defects is increased among individuals who have insulin dependent diabetes.  The remainder of the cases (>70%) are classified as multifactorial, referring to the condition being caused by a combination of both environmental and genetic causes.  We reviewed multifactorial inheritance and the associated risks of  recurrence.  Ms. SAlexsandria Kivettpreviously had noninvasive prenatal screening (NIPS) for fetal aneuploidy performed through her OB office. We reviewed these results, which were within normal range,  indicating a less than 1 in 10,000 risk for trisomies 21, 18, 13, and monosomy X. Analysis for 22q11.2 deletion syndrome was also performed and was within normal range, indicating a 1 in 13,300 risk for the condition. We reviewed the sensitivity and specificity for the conditions assessed and the methodology of this screen. Specifically, we discussed that the negative predictive value for 22q11.2 deletion syndrome in this pregnancy given the ultrasound finding is approximately 95%-98%.  In addition, we reviewed the option of amniocentesis including the associated risks, benefits, and limitations.  She was counseled that chromosome analysis can be performed both prenatally (amniocentesis) and postnatally (cord blood or peripheral blood).  Additionally, we discussed the availability of microarray analysis, which can also be performed pre and postnatally.  We reviewed that microarray analysis is a molecular based technique in which a test sample of DNA (fetal or infant) is compared to a reference (normal control) genome in order to determine if the test sample has any extra or missing genetic information.  Microarray analysis allows for the detection of genetic deletions (including 22q11 deletion syndrome) and duplications that are 161 times smaller than those identified by routine chromosome analysis. After thoughtful consideration of these options, Regina Fox declined amniocentesis.    We discussed that there is a newer NIPS platform ([facility] through [facility]) that is able to assess for mutations in a panel of 30 selected single genes. The conditions selected for this panel are autosomal dominant or X-linked conditions that typically occur due to de novo gene variations. We reviewed the methodology behind this  screen and that the reported detection rate ranges from 43% to greater than 96%, depending upon the specific condition and specific gene. We discussed that some but not all of the conditions included on this panel can have congenital heart disease as a feature, and that this panel is not an exhaustive list of single gene conditions. We reviewed possible results including positive, negative, unexpected findings, and no result. We reviewed limitations of the test including that it is not diagnostic, it relies on a high enough fetal fraction in the sample, and that if the patient carriers a mutation in one of the genes on the panel, analysis cannot be performed for the pregnancy. We also discussed that if a mutation is identified in the paternal sample, this will also be reported to the couple. We reviewed the billing process and possible cost of the test. After careful consideration, Ms. Tylea Lopez declined single gene NIPS ([facility]) at this time; she planned to discuss this testing option further with her husband. She understands that ultrasound cannot rule out all birth defects or genetic syndromes. The patient was advised of this limitation and states she still does not want diagnostic testing at this time.   We then discussed the option of a postnatal genetics evaluation/consult.  We discussed that the postnatal prognosis is highly dependent upon the underlying etiology.  Follow up ultrasound and follow-up fetal echocardiogram have been scheduled.   Both family histories were reviewed in detail and were found to be contributory for a sister to the father of the pregnancy (one of his 54 siblings) with a congenital hole in the heart that did not require surgical correction. This sister is reportedly healthy. Additionally, the father of the pregnancy has a nephew (his brother's son) with Down syndrome and congenital heart disease. The patient reported that the mother of this child was 64 years old in the  pregnancy. No additional relatives were reported with  Down syndrome. We discussed that the reported heart disease for his sister is most suggestive of isolated, multifactorial etiology, though other etiologies cannot be ruled out without additional information. We discussed that the nephew's heart disease would be expected to be related to his underlying diagnosis of Down syndrome and recurrence risk would thus, depend on the type of Down syndrome. We discussed that 95% of cases of Down syndrome are not inherited and are the result of non-disjunction.  Three to 4% of cases of Down syndrome are the result of a translocation involving chromosome #21.  We discussed the option of chromosome analysis to determine if an individual is a carrier of a balanced translocation involving chromosome #21.  If an individual carries a balanced translocation involving chromosome #21, then the chance to have a baby with Down syndrome would be greater than the maternal age-related risk.  The reported family history is most suggestive of sporadically occurring Down syndrome.  Without further information regarding the provided family history, an accurate genetic risk cannot be calculated.  Further genetic counseling is warranted if additional information is obtained.  Ms. Angeleah Labrake was provided with written information regarding cystic fibrosis (CF), spinal muscular atrophy (SMA) and hemoglobinopathies including the carrier frequency, availability of carrier screening and prenatal diagnosis if indicated.  In addition, we discussed that CF and hemoglobinopathies are routinely screened for as part of the [place] newborn screening panel.  She declined screening for CF, SMA and hemoglobinopathies..   Ms. Analee Montee denied exposure to environmental toxins or chemical agents. She denied the use of alcohol, tobacco or street drugs. She denied significant viral illnesses during the course of her pregnancy. Her medical and surgical  histories were noncontributory.   I counseled Ms. Chinita [employer] regarding the above risks and available options.  The approximate face-to-face time with the genetic counselor was 35 minutes.  Chipper Oman, MS Certified Genetic Counselor 03/21/2016

## 2016-04-19 ENCOUNTER — Ambulatory Visit (HOSPITAL_COMMUNITY)
Admission: RE | Admit: 2016-04-19 | Discharge: 2016-04-19 | Disposition: A | Discharge status: Home / Self Care | Payer: BC Managed Care – PPO | Source: Ambulatory Visit | Attending: Obstetrics and Gynecology | Admitting: Obstetrics and Gynecology

## 2016-04-19 ENCOUNTER — Encounter (HOSPITAL_COMMUNITY): Payer: Self-pay

## 2016-04-19 VITALS — BP 116/63 | HR 84 | Wt 186.6 lb

## 2016-04-19 DIAGNOSIS — O359XX0 Maternal care for (suspected) fetal abnormality and damage, unspecified, not applicable or unspecified: Secondary | ICD-10-CM | POA: Diagnosis not present

## 2016-04-19 DIAGNOSIS — Z3A29 29 weeks gestation of pregnancy: Secondary | ICD-10-CM | POA: Insufficient documentation

## 2016-04-19 DIAGNOSIS — Q249 Congenital malformation of heart, unspecified: Secondary | ICD-10-CM

## 2016-04-19 DIAGNOSIS — Q200 Common arterial trunk: Secondary | ICD-10-CM | POA: Insufficient documentation

## 2016-04-19 DIAGNOSIS — Z3683 Encounter for fetal screening for congenital cardiac abnormalities: Secondary | ICD-10-CM | POA: Diagnosis not present

## 2016-05-11 ENCOUNTER — Inpatient Hospital Stay (HOSPITAL_COMMUNITY)
Admission: AD | Admit: 2016-05-11 | Discharge: 2016-05-11 | Disposition: A | Discharge status: Home / Self Care | Payer: BC Managed Care – PPO | Source: Ambulatory Visit | Attending: Obstetrics and Gynecology | Admitting: Obstetrics and Gynecology

## 2016-05-11 ENCOUNTER — Encounter (HOSPITAL_COMMUNITY): Payer: Self-pay

## 2016-05-11 DIAGNOSIS — O9989 Other specified diseases and conditions complicating pregnancy, childbirth and the puerperium: Secondary | ICD-10-CM

## 2016-05-11 DIAGNOSIS — Z3689 Encounter for other specified antenatal screening: Secondary | ICD-10-CM

## 2016-05-11 DIAGNOSIS — Z79899 Other long term (current) drug therapy: Secondary | ICD-10-CM | POA: Insufficient documentation

## 2016-05-11 DIAGNOSIS — O26899 Other specified pregnancy related conditions, unspecified trimester: Secondary | ICD-10-CM | POA: Insufficient documentation

## 2016-05-11 DIAGNOSIS — R109 Unspecified abdominal pain: Secondary | ICD-10-CM | POA: Diagnosis not present

## 2016-05-11 DIAGNOSIS — Z3A00 Weeks of gestation of pregnancy not specified: Secondary | ICD-10-CM | POA: Insufficient documentation

## 2016-05-11 DIAGNOSIS — R102 Pelvic and perineal pain: Secondary | ICD-10-CM | POA: Insufficient documentation

## 2016-05-11 MED ORDER — LACTATED RINGERS IV BOLUS (SEPSIS)
1000.0000 mL | Freq: Once | INTRAVENOUS | Status: AC
  Administered 2016-05-11: 1000 mL via INTRAVENOUS

## 2016-05-11 MED ORDER — BETAMETHASONE SOD PHOS & ACET 6 (3-3) MG/ML IJ SUSP
12.0000 mg | INTRAMUSCULAR | Status: DC
  Administered 2016-05-11: 12 mg via INTRAMUSCULAR
  Filled 2016-05-11 (×2): qty 2

## 2016-05-11 NOTE — MAU Note (Signed)
Pt was in office today. Put her on the monitor and had some ctxs. SVE 1cm dilated. Pt sent in for further monitoring.

## 2016-05-11 NOTE — Discharge Instructions (Signed)
. °Preterm Labor and Birth Information °The normal length of a pregnancy is 39-41 weeks. Preterm labor is when labor starts before 37 completed weeks of pregnancy. °What are the risk factors for preterm labor? °Preterm labor is more likely to occur in women who: °· Have certain infections during pregnancy such as a bladder infection, sexually transmitted infection, or infection inside the uterus (chorioamnionitis). °· Have a shorter-than-normal cervix. °· Have gone into preterm labor before. °· Have had surgery on their cervix. °· Are younger than age 17 or older than age 35. °· Are African American. °· Are pregnant with twins or multiple babies (multiple gestation). °· Take street drugs or smoke while pregnant. °· Do not gain enough weight while pregnant. °· Became pregnant shortly after having been pregnant. °What are the symptoms of preterm labor? °Symptoms of preterm labor include: °· Cramps similar to those that can happen during a menstrual period. The cramps may happen with diarrhea. °· Pain in the abdomen or lower back. °· Regular uterine contractions that may feel like tightening of the abdomen. °· A feeling of increased pressure in the pelvis. °· Increased watery or bloody mucus discharge from the vagina. °· Water breaking (ruptured amniotic sac). °Why is it important to recognize signs of preterm labor? °It is important to recognize signs of preterm labor because babies who are born prematurely may not be fully developed. This can put them at an increased risk for: °· Long-term (chronic) heart and lung problems. °· Difficulty immediately after birth with regulating body systems, including blood sugar, body temperature, heart rate, and breathing rate. °· Bleeding in the brain. °· Cerebral palsy. °· Learning difficulties. °· Death. °These risks are highest for babies who are born before 34 weeks of pregnancy. °How is preterm labor treated? °Treatment depends on the length of your pregnancy, your condition,  and the health of your baby. It may involve: °· Having a stitch (suture) placed in your cervix to prevent your cervix from opening too early (cerclage). °· Taking or being given medicines, such as: °¨ Hormone medicines. These may be given early in pregnancy to help support the pregnancy. °¨ Medicine to stop contractions. °¨ Medicines to help mature the baby’s lungs. These may be prescribed if the risk of delivery is high. °¨ Medicines to prevent your baby from developing cerebral palsy. °If the labor happens before 34 weeks of pregnancy, you may need to stay in the hospital. °What should I do if I think I am in preterm labor? °If you think that you are going into preterm labor, call your health care provider right away. °How can I prevent preterm labor in future pregnancies? °To increase your chance of having a full-term pregnancy: °· Do not use any tobacco products, such as cigarettes, chewing tobacco, and e-cigarettes. If you need help quitting, ask your health care provider. °· Do not use street drugs or medicines that have not been prescribed to you during your pregnancy. °· Talk with your health care provider before taking any herbal supplements, even if you have been taking them regularly. °· Make sure you gain a healthy amount of weight during your pregnancy. °· Watch for infection. If you think that you might have an infection, get it checked right away. °· Make sure to tell your health care provider if you have gone into preterm labor before. °This information is not intended to replace advice given to you by your health care provider. Make sure you discuss any questions you have with your   health care provider. °Document Released: 03/25/2003 Document Revised: 06/15/2015 Document Reviewed: 05/26/2015 °Elsevier Interactive Patient Education © 2017 [employer] ° °

## 2016-05-11 NOTE — MAU Note (Signed)
Urine in lab 

## 2016-05-11 NOTE — MAU Provider Note (Signed)
History   Patient Regina Fox is a [id number] At [redacted]w[redacted]d  here with complaints of abdominal pain and possible pre-term labor at her ob-gyn appointment today. She was sent here for evaluation for pre-term labor. Per Dr. Henderson Cloud, patient to receive IV fluids, first course of betamethasone and NST.  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  CSN: [id number]  Arrival date and time: 05/11/16 1159   First Provider Initiated Contact with Patient 05/11/16 1333      No chief complaint on file.  Abdominal Pain  This is a new problem. The current episode started yesterday. The onset quality is sudden. The problem has been gradually improving. The pain is located in the suprapubic region. The pain is at a severity of 4/10. The quality of the pain is cramping. The abdominal pain does not radiate. Pertinent negatives include no diarrhea, nausea or vomiting. The pain is aggravated by movement. The pain is relieved by nothing. She has tried acetaminophen for the symptoms. The treatment provided no relief.    OB History    Gravida Para Term Preterm AB Living   SAB TAB Ectopic Multiple Live Births   Past Medical History:  Diagnosis Date  . Allergic rhinitis, cause unspecified 07/18/2010  . RASH-NONVESICULAR 10/01/2009  . URI 08/22/2007  . Vitamin D deficiency 02/15/2012    Past Surgical History:  Procedure Laterality Date  . Denied Surgical Hx    . ECTOPIC PREGNANCY SURGERY    . LAPAROSCOPY N/A 06/14/2012   Procedure: LAPAROSCOPY OPERATIVE;  Surgeon: Freddrick March. Tenny Craw, MD;  Location: [facility];  Service: Gynecology;  Laterality: N/A;    History reviewed. No pertinent family history.  Social History  Substance Use Topics  . Smoking status: Never Smoker  . Smokeless tobacco: Never Used  . Alcohol use No    Allergies: No Known Allergies  Prescriptions Prior to Admission  Medication Sig Dispense Refill Last Dose  . Acetaminophen  (TYLENOL PO) Take by mouth.   Taking  . amoxicillin (AMOXIL) 500 MG capsule Take 1 capsule (500 mg total) by mouth 3 (three) times daily. (Patient not taking: Reported on 02/15/2016) 30 capsule 0 Not Taking  . DYMISTA 137-50 MCG/ACT SUSP PLACE 1 SPRAY INTO THE NOSE 2 (TWO) TIMES DAILY. (Patient not taking: Reported on 02/15/2016) 23 g 1 Not Taking  . Prenatal Vit-Fe Fumarate-FA (PRENATAL MULTIVITAMIN) TABS Take 1 tablet by mouth daily at 12 noon.   Taking    Review of Systems  Respiratory: Negative.   Cardiovascular: Negative.   Gastrointestinal: Positive for abdominal pain. Negative for diarrhea, nausea and vomiting.   Physical Exam   Blood pressure 116/64, pulse 90, temperature 98.5 F (36.9 C), temperature source Oral, resp. rate 18, last menstrual period 09/26/2015, unknown if currently breastfeeding.  Physical Exam  Constitutional: She is oriented to person, place, and time. She appears well-developed and well-nourished.  HENT:  Head: Normocephalic.  Neck: Normal range of motion.  Respiratory: Effort normal.  GI: Soft. Bowel sounds are normal.  Genitourinary:  Genitourinary Comments: NEFG; 1 cm, soft, posterior  Musculoskeletal: Normal range of motion.  Neurological: She is alert and oriented to person, place, and time.  Skin: Skin is warm and dry.  Psychiatric: She has a normal mood and affect.    MAU Course  Procedures  MDM NST: baseline is 130; mod variability, no decels,  present acels, no contractions IV fluids Beta methasone x1  Patient feeling frequent fetal movements, denies abdominal pain, leaking, bleeding at this time.   Assessment and Plan   1. NST (non-stress test) reactive    2. Patient stable for discharge per Dr. Henderson Cloud; patient to call the office in the morning to find out if she can get her second dose of beta in the office or if she needs to come to MAU.  3. Reviewed warning signs and when to return to MAU (bleeding, leaking of fluid, decreased fetal  movements, increased contractions) 4. Patient will call Berton Lan to begin her transfer of care.   Charlesetta Garibaldi Kooistra CNM 05/11/2016, 1:52 PM

## 2016-05-12 ENCOUNTER — Inpatient Hospital Stay (HOSPITAL_COMMUNITY)
Admission: AD | Admit: 2016-05-12 | Discharge: 2016-05-12 | Disposition: A | Discharge status: Home / Self Care | Payer: BC Managed Care – PPO | Source: Ambulatory Visit | Attending: Obstetrics and Gynecology | Admitting: Obstetrics and Gynecology

## 2016-05-12 DIAGNOSIS — O358XX0 Maternal care for other (suspected) fetal abnormality and damage, not applicable or unspecified: Secondary | ICD-10-CM | POA: Diagnosis not present

## 2016-05-12 DIAGNOSIS — Z3A00 Weeks of gestation of pregnancy not specified: Secondary | ICD-10-CM | POA: Insufficient documentation

## 2016-05-12 MED ORDER — BETAMETHASONE SOD PHOS & ACET 6 (3-3) MG/ML IJ SUSP
12.0000 mg | Freq: Once | INTRAMUSCULAR | Status: AC
  Administered 2016-05-12: 12 mg via INTRAMUSCULAR
  Filled 2016-05-12: qty 2

## 2016-05-12 NOTE — MAU Note (Signed)
Pt presents for 2nd dose of betamethasone injections. Denies any pain

## 2016-05-17 ENCOUNTER — Other Ambulatory Visit (HOSPITAL_COMMUNITY): Payer: Self-pay | Admitting: Maternal and Fetal Medicine

## 2016-05-17 ENCOUNTER — Encounter (HOSPITAL_COMMUNITY): Payer: Self-pay

## 2016-05-17 ENCOUNTER — Ambulatory Visit (HOSPITAL_COMMUNITY)
Admission: RE | Admit: 2016-05-17 | Discharge: 2016-05-17 | Disposition: A | Discharge status: Home / Self Care | Payer: BC Managed Care – PPO | Source: Ambulatory Visit | Attending: Obstetrics and Gynecology | Admitting: Obstetrics and Gynecology

## 2016-05-17 DIAGNOSIS — O35BXX0 Maternal care for other (suspected) fetal abnormality and damage, fetal cardiac anomalies, not applicable or unspecified: Secondary | ICD-10-CM

## 2016-05-17 DIAGNOSIS — Z3683 Encounter for fetal screening for congenital cardiac abnormalities: Secondary | ICD-10-CM | POA: Insufficient documentation

## 2016-05-17 DIAGNOSIS — Z362 Encounter for other antenatal screening follow-up: Secondary | ICD-10-CM

## 2016-05-17 DIAGNOSIS — O99213 Obesity complicating pregnancy, third trimester: Secondary | ICD-10-CM

## 2016-05-17 DIAGNOSIS — Z3A33 33 weeks gestation of pregnancy: Secondary | ICD-10-CM | POA: Diagnosis not present

## 2016-05-17 DIAGNOSIS — O358XX0 Maternal care for other (suspected) fetal abnormality and damage, not applicable or unspecified: Secondary | ICD-10-CM

## 2016-05-17 DIAGNOSIS — O359XX0 Maternal care for (suspected) fetal abnormality and damage, unspecified, not applicable or unspecified: Secondary | ICD-10-CM | POA: Diagnosis present

## 2016-06-14 ENCOUNTER — Encounter (HOSPITAL_COMMUNITY): Payer: Self-pay

## 2016-06-14 ENCOUNTER — Ambulatory Visit (HOSPITAL_COMMUNITY)
Admission: RE | Admit: 2016-06-14 | Discharge: 2016-06-14 | Disposition: A | Discharge status: Home / Self Care | Payer: BC Managed Care – PPO | Source: Ambulatory Visit | Attending: Obstetrics and Gynecology | Admitting: Obstetrics and Gynecology

## 2016-06-14 DIAGNOSIS — Z362 Encounter for other antenatal screening follow-up: Secondary | ICD-10-CM | POA: Insufficient documentation

## 2016-06-14 DIAGNOSIS — Z3A37 37 weeks gestation of pregnancy: Secondary | ICD-10-CM | POA: Insufficient documentation

## 2016-06-14 DIAGNOSIS — Z3683 Encounter for fetal screening for congenital cardiac abnormalities: Secondary | ICD-10-CM | POA: Diagnosis not present

## 2016-06-14 DIAGNOSIS — O359XX0 Maternal care for (suspected) fetal abnormality and damage, unspecified, not applicable or unspecified: Secondary | ICD-10-CM | POA: Insufficient documentation

## 2016-09-25 ENCOUNTER — Telehealth: Payer: Self-pay | Admitting: Internal Medicine

## 2016-09-25 NOTE — Telephone Encounter (Signed)
[facility] - Client TELEPHONE ADVICE RECORD [facility]  Patient Name: Regina FurnishSUHEER Alcindor  DOB: 08/20/1980    Initial Comment Pt is having SOB   Nurse Assessment  Nurse: Laural BenesJohnson, RN, Dondra SpryGail Date/Time (Eastern Time): 09/25/2016 9:05:27 AM  Confirm and document reason for call. If symptomatic, describe symptoms. ---[name] delivered baby in June and started having chest pain two weeks later; comes and goes -- at times has shortness of breath with it. no fever  Does the patient have any new or worsening symptoms? ---Yes  Will a triage be completed? ---Yes  Related visit to physician within the last 2 weeks? ---No  Does the PT have any chronic conditions? (i.e. diabetes, asthma, etc.) ---Yes  List chronic conditions. ---post partum x 3 months  Is the patient pregnant or possibly pregnant? (Ask all females between the ages of 8012-55) ---No  Is this a behavioral health or substance abuse call? ---No     Guidelines    Guideline Title Affirmed Question Affirmed Notes  Chest Pain [1] Chest pain lasts > 5 minutes AND [2] occurred > 3 days ago (72 hours) AND [3] NO chest pain or cardiac symptoms now    Final Disposition User   See Physician within 24 Hours HuntleyJohnson, RN, Dondra SpryGail    Comments  NOTE: 09/26/2016 11am w/ Dr. Oliver BarreJames John for chestpain intermittent shortness of breath   Referrals  REFERRED TO PCP OFFICE   Disagree/Comply: Comply

## 2016-09-26 ENCOUNTER — Ambulatory Visit (INDEPENDENT_AMBULATORY_CARE_PROVIDER_SITE_OTHER): Payer: BC Managed Care – PPO | Admitting: Internal Medicine

## 2016-09-26 ENCOUNTER — Ambulatory Visit (INDEPENDENT_AMBULATORY_CARE_PROVIDER_SITE_OTHER)
Admission: RE | Admit: 2016-09-26 | Discharge: 2016-09-26 | Disposition: A | Discharge status: Home / Self Care | Payer: BC Managed Care – PPO | Source: Ambulatory Visit | Attending: Internal Medicine | Admitting: Internal Medicine

## 2016-09-26 ENCOUNTER — Encounter: Payer: Self-pay | Admitting: Internal Medicine

## 2016-09-26 VITALS — BP 112/74 | HR 74 | Temp 97.8°F | Ht 62.0 in | Wt 179.0 lb

## 2016-09-26 DIAGNOSIS — E559 Vitamin D deficiency, unspecified: Secondary | ICD-10-CM

## 2016-09-26 DIAGNOSIS — M5432 Sciatica, left side: Secondary | ICD-10-CM | POA: Insufficient documentation

## 2016-09-26 DIAGNOSIS — R079 Chest pain, unspecified: Secondary | ICD-10-CM

## 2016-09-26 DIAGNOSIS — S46819A Strain of other muscles, fascia and tendons at shoulder and upper arm level, unspecified arm, initial encounter: Secondary | ICD-10-CM

## 2016-09-26 DIAGNOSIS — Z0001 Encounter for general adult medical examination with abnormal findings: Secondary | ICD-10-CM | POA: Diagnosis not present

## 2016-09-26 DIAGNOSIS — J309 Allergic rhinitis, unspecified: Secondary | ICD-10-CM | POA: Diagnosis not present

## 2016-09-26 DIAGNOSIS — D509 Iron deficiency anemia, unspecified: Secondary | ICD-10-CM | POA: Diagnosis not present

## 2016-09-26 MED ORDER — CETIRIZINE HCL 10 MG PO TABS: 10.0000 mg | ORAL_TABLET | Freq: Every day | ORAL | 11 refills | Status: AC

## 2016-09-26 MED ORDER — CYCLOBENZAPRINE HCL 5 MG PO TABS
5.0000 mg | ORAL_TABLET | Freq: Three times a day (TID) | ORAL | 1 refills | Status: AC | PRN
Start: 2016-09-26 — End: ?

## 2016-09-26 NOTE — Progress Notes (Signed)
Subjective:    Patient ID: Regina Fox, female    DOB: 10/30/1980, 36 y.o.   MRN: [id number]  HPI  Here for wellness and f/u;  Overall doing ok;  Pt denies wheezing, orthopnea, PND, worsening LE edema, palpitations, dizziness or syncope.  Pt denies neurological change such as new headache, facial or extremity weakness.  Pt denies polydipsia, polyuria, or low sugar symptoms. Pt states overall good compliance with treatment and medications, good tolerability, and has been somewhat trying to follow appropriate diet.  Pt denies worsening depressive symptoms, suicidal ideation or panic. No fever, night sweats, wt loss, loss of appetite, or other constitutional symptoms.  Pt states good ability with ADL's, has low fall risk, no other significant changes in hearing or vision, an occasionally active with exercise.  Not pregnant Has several other issues today.  C/o mid upper sternal bilat recurring sharp chest pain, mild pleuritic but not positional or exertional.  Not assoc with radiation, sob, diaphoresis, n/v, palps or dizziness.  Pain seemed to start x 3 mo since post partum, with 4th child birth difficult per pt. Also does have several wks ongoing nasal allergy symptoms with clearish congestion, itch and sneezing, without fever, pain, ST, cough. Also has tender soreness to bilat upper back worse to move the head and neck flexion and somewhat with using either arm as well, ongoing mild , intermittent x several weeks.  Also c/o intermittent pain to the left lower back mild intermittent with some radiation to the left upper leg, but no bowel or bladder change, fever, wt loss,  worsening LE numbness/weakness, gait change or falls.  Still can exercise on the home treadmill every other day with 30 min at 3.5 MPH without aggrevation of the pain.  Alleve helps, nothing else makes worse such as standing up or bending.  Also asks for iron check as has been iron deficient in the past.  Is incidentally s/p IUD placement x 2  wks but still some menstrual bleeding which is not unexpected with initial placement Past Medical History:  Diagnosis Date  . Allergic rhinitis, cause unspecified 07/18/2010  . RASH-NONVESICULAR 10/01/2009  . URI 08/22/2007  . Vitamin D deficiency 02/15/2012   Past Surgical History:  Procedure Laterality Date  . Denied Surgical Hx    . ECTOPIC PREGNANCY SURGERY    . LAPAROSCOPY N/A 06/14/2012   Procedure: LAPAROSCOPY OPERATIVE;  Surgeon: Freddrick MarchKendra H. Tenny Crawoss, MD;  Location: [facility];  Service: Gynecology;  Laterality: N/A;    reports that she has never smoked. She has never used smokeless tobacco. She reports that she does not drink alcohol or use drugs. family history is not on file. No Known Allergies Current Outpatient Prescriptions on File Prior to Visit  Medication Sig Dispense Refill  . calcium carbonate (TUMS - DOSED IN MG ELEMENTAL CALCIUM) 500 MG chewable tablet Chew 1 tablet by mouth daily.    . Prenatal Vit-Fe Fumarate-FA (PRENATAL MULTIVITAMIN) TABS Take 1 tablet by mouth daily at 12 noon.     No current facility-administered medications on file prior to visit.    Review of Systems Constitutional: Negative for other unusual diaphoresis, sweats, appetite or weight changes HENT: Negative for other worsening hearing loss, ear pain, facial swelling, mouth sores or neck stiffness.   Eyes: Negative for other worsening pain, redness or other visual disturbance.  Respiratory: Negative for other stridor or swelling Cardiovascular: Negative for other palpitations or other chest pain  Gastrointestinal: Negative for worsening diarrhea or loose stools, blood in stool,  distention or other pain Genitourinary: Negative for hematuria, flank pain or other change in urine volume.  Musculoskeletal: Negative for myalgias or other joint swelling.  Skin: Negative for other color change, or other wound or worsening drainage.  Neurological: Negative for other syncope or numbness. Hematological: Negative for  other adenopathy or swelling Psychiatric/Behavioral: Negative for hallucinations, other worsening agitation, SI, self-injury, or new decreased concentration All other system neg per pt    Objective:   Physical Exam BP 112/74   Pulse 74   Temp 97.8 F (36.6 C) (Oral)   Ht  (1.575 m)   Wt 179 lb (81.2 kg)   LMP 09/26/2015 (LMP Unknown) Comment: on IUD after delivery   SpO2 100%   BMI 32.74 kg/m  VS noted,  Constitutional: Pt is oriented to person, place, and time. Appears well-developed and well-nourished, in no significant distress and comfortable Head: Normocephalic and atraumatic  Eyes: Conjunctivae and EOM are normal. Pupils are equal, round, and reactive to light Bilat tm's with mild erythema.  Max sinus areas non tender.  Pharynx with mild erythema, no exudate Right Ear: External ear normal without discharge Left Ear: External ear normal without discharge Nose: Nose without discharge or deformity Mouth/Throat: Oropharynx is without other ulcerations and moist  Neck: Normal range of motion. Neck supple. No JVD present. No tracheal deviation present or significant neck LA or mass Cardiovascular: Normal rate, regular rhythm, normal heart sounds and intact distal pulses.   Pulmonary/Chest: WOB normal and breath sounds without rales or wheezing  Abdominal: Soft. Bowel sounds are normal. NT. No HSM  Musculoskeletal: Normal range of motion. Exhibits no edema; has tender to bilat upper sternal area without swelling or rash; also has tender bilat trapezoid area without significant shoulder or c-spine tenderness or pain Lymphadenopathy: Has no other cervical adenopathy.  Neurological: Pt is alert and oriented to person, place, and time. Pt has normal reflexes. No cranial nerve deficit. Motor 5/5 intact throughout, Gait intact Skin: Skin is warm and dry. No rash noted or new ulcerations Psychiatric:  Has normal mood and affect. Behavior is normal without agitation No other exam  findings Lab Results  Component Value Date   WBC 7.9 06/15/2013   HGB 12.2 06/15/2013   HCT 36.1 06/15/2013   PLT 133 (L) 06/15/2013   GLUCOSE 92 02/12/2012   CHOL 172 02/12/2012   TRIG 30.0 02/12/2012   HDL 55.20 02/12/2012   LDLCALC 111 (H) 02/12/2012   ALT 22 02/12/2012   AST 25 02/12/2012   NA 137 02/12/2012   K 3.9 02/12/2012   CL 103 02/12/2012   CREATININE 0.7 02/12/2012   BUN 14 02/12/2012   CO2 26 02/12/2012   TSH 1.21 02/12/2012       Assessment & Plan:

## 2016-09-26 NOTE — Patient Instructions (Signed)
Please take all new medication as prescribed - the zyrtec for allergies, and the flexeril for muscle relaxer as needed  OK to take Alleve twice per day as needed  Please continue all other medications as before, and refills have been done if requested.  Please have the pharmacy call with any other refills you may need.  Please continue your efforts at being more active, low cholesterol diet, and weight control.  You are otherwise up to date with prevention measures today.  Please keep your appointments with your specialists as you may have planned  Please go to the XRAY Department in the Basement (go straight as you get off the elevator) for the x-ray testing  Please go to the LAB in the Basement (turn left off the elevator) for the tests to be done at your convenience  You will be contacted by phone if any changes need to be made immediately.  Otherwise, you will receive a letter about your results with an explanation, but please check with MyChart first.  Please remember to sign up for MyChart if you have not done so, as this will be important to you in the future with finding out test results, communicating by private email, and scheduling acute appointments online when needed.  Please return in 1 year for your yearly visit, or sooner if needed

## 2016-10-01 DIAGNOSIS — S46819A Strain of other muscles, fascia and tendons at shoulder and upper arm level, unspecified arm, initial encounter: Secondary | ICD-10-CM | POA: Insufficient documentation

## 2016-10-01 NOTE — Assessment & Plan Note (Signed)
With heavy periods per pt including now, for iron level

## 2016-10-01 NOTE — Assessment & Plan Note (Addendum)
C/w costochondritis, d/w pt and husband, for cxr but o/w would tx conservatively with alleve prn  Note:  Total time for pt hx, exam, review of record with pt in the room, determination of diagnoses and plan for further eval and tx is > 40 min, with over 50% spent in coordination and counseling of patient, including the differential dx, tx, further evaluation and other management of numerous issues including chest pain, upper back pain, left sciatica, vit D and iron deficiency, and allergic rhinitis

## 2016-10-01 NOTE — Assessment & Plan Note (Signed)
Mild to mod, for zyrtec or allegra otc prn,  to f/u any worsening symptoms or concerns

## 2016-10-01 NOTE — Assessment & Plan Note (Signed)

## 2016-10-01 NOTE — Assessment & Plan Note (Signed)
Mild intermittent, no neuro changes, ok for alleve prn and  to f/u any worsening symptoms or concerns

## 2016-10-01 NOTE — Assessment & Plan Note (Signed)
Mild to mod, for alleve prn, declines muscle relaxer for now, to f/u any worsening symptoms or concerns

## 2016-10-01 NOTE — Assessment & Plan Note (Signed)
Also for Vit D check as she is not outdoors often

## 2016-10-04 ENCOUNTER — Telehealth: Payer: Self-pay | Admitting: Internal Medicine

## 2016-10-04 DIAGNOSIS — M5432 Sciatica, left side: Secondary | ICD-10-CM

## 2016-10-04 DIAGNOSIS — M791 Myalgia, unspecified site: Secondary | ICD-10-CM

## 2016-10-04 NOTE — Telephone Encounter (Signed)
Patient is requesting to be referred to [facility] based off of last OV notes.  Patient would like to be referred to Dr. Leanord Hawking.

## 2016-10-04 NOTE — Telephone Encounter (Signed)
Ok, this is done 

## 2016-10-04 NOTE — Telephone Encounter (Signed)
Patient knows to follow up with office if she has not received a call in the next two weeks.

## 2016-10-13 ENCOUNTER — Other Ambulatory Visit (INDEPENDENT_AMBULATORY_CARE_PROVIDER_SITE_OTHER): Payer: BC Managed Care – PPO

## 2016-10-13 ENCOUNTER — Telehealth: Payer: Self-pay

## 2016-10-13 ENCOUNTER — Encounter: Payer: Self-pay | Admitting: Family Medicine

## 2016-10-13 ENCOUNTER — Ambulatory Visit (INDEPENDENT_AMBULATORY_CARE_PROVIDER_SITE_OTHER): Payer: BC Managed Care – PPO | Admitting: Family Medicine

## 2016-10-13 VITALS — BP 100/80 | HR 87 | Ht 62.0 in | Wt 178.0 lb

## 2016-10-13 DIAGNOSIS — M533 Sacrococcygeal disorders, not elsewhere classified: Secondary | ICD-10-CM

## 2016-10-13 DIAGNOSIS — D509 Iron deficiency anemia, unspecified: Secondary | ICD-10-CM | POA: Diagnosis not present

## 2016-10-13 DIAGNOSIS — Z0001 Encounter for general adult medical examination with abnormal findings: Secondary | ICD-10-CM | POA: Diagnosis not present

## 2016-10-13 DIAGNOSIS — M2550 Pain in unspecified joint: Secondary | ICD-10-CM | POA: Diagnosis not present

## 2016-10-13 DIAGNOSIS — E559 Vitamin D deficiency, unspecified: Secondary | ICD-10-CM | POA: Diagnosis not present

## 2016-10-13 LAB — URINALYSIS, ROUTINE W REFLEX MICROSCOPIC
BILIRUBIN URINE: NEGATIVE
HGB URINE DIPSTICK: NEGATIVE
Ketones, ur: NEGATIVE
NITRITE: NEGATIVE
Specific Gravity, Urine: >=1.03 — AB (ref 1.000–1.030)
TOTAL PROTEIN, URINE-UPE24: NEGATIVE
Urine Glucose: NEGATIVE
Urobilinogen, UA: 0.2 (ref 0.0–1.0)
pH: 6 (ref 5.0–8.0)

## 2016-10-13 LAB — IBC PANEL
IRON: 120 ug/dL (ref 42–145)
Saturation Ratios: 36.2 % (ref 20.0–50.0)
Transferrin: 237 mg/dL (ref 212.0–360.0)

## 2016-10-13 LAB — HEPATIC FUNCTION PANEL
ALT: 16 U/L (ref 0–35)
AST: 17 U/L (ref 0–37)
Albumin: 4.4 g/dL (ref 3.5–5.2)
Alkaline Phosphatase: 60 U/L (ref 39–117)
BILIRUBIN TOTAL: 0.7 mg/dL (ref 0.2–1.2)
Bilirubin, Direct: 0.1 mg/dL (ref 0.0–0.3)
Total Protein: 7.4 g/dL (ref 6.0–8.3)

## 2016-10-13 LAB — CBC WITH DIFFERENTIAL/PLATELET
BASOS PCT: 0.4 % (ref 0.0–3.0)
Basophils Absolute: 0 10*3/uL (ref 0.0–0.1)
EOS PCT: 0.8 % (ref 0.0–5.0)
Eosinophils Absolute: 0 10*3/uL (ref 0.0–0.7)
HEMATOCRIT: 40.1 % (ref 36.0–46.0)
HEMOGLOBIN: 13.3 g/dL (ref 12.0–15.0)
LYMPHS PCT: 23.3 % (ref 12.0–46.0)
Lymphs Abs: 1.4 10*3/uL (ref 0.7–4.0)
MCHC: 33.1 g/dL (ref 30.0–36.0)
MCV: 87 fl (ref 78.0–100.0)
Monocytes Absolute: 0.4 10*3/uL (ref 0.1–1.0)
Monocytes Relative: 5.8 % (ref 3.0–12.0)
Neutro Abs: 4.3 10*3/uL (ref 1.4–7.7)
Neutrophils Relative %: 69.7 % (ref 43.0–77.0)
Platelets: 233 10*3/uL (ref 150.0–400.0)
RBC: 4.61 Mil/uL (ref 3.87–5.11)
RDW: 12.7 % (ref 11.5–15.5)
WBC: 6.1 10*3/uL (ref 4.0–10.5)

## 2016-10-13 LAB — LIPID PANEL
CHOL/HDL RATIO: 4
Cholesterol: 180 mg/dL (ref 0–200)
HDL: 51 mg/dL (ref >39.00)
LDL CALC: 119 mg/dL — AB (ref 0–99)
NonHDL: 129.4
TRIGLYCERIDES: 54 mg/dL (ref 0.0–149.0)
VLDL: 10.8 mg/dL (ref 0.0–40.0)

## 2016-10-13 LAB — BASIC METABOLIC PANEL
BUN: 10 mg/dL (ref 6–23)
CO2: 27 mEq/L (ref 19–32)
CREATININE: 0.63 mg/dL (ref 0.40–1.20)
Calcium: 9.6 mg/dL (ref 8.4–10.5)
Chloride: 102 mEq/L (ref 96–112)
GFR: 114.87 mL/min (ref >60.00)
Glucose, Bld: 101 mg/dL — ABNORMAL HIGH (ref 70–99)
POTASSIUM: 3.5 meq/L (ref 3.5–5.1)
Sodium: 136 mEq/L (ref 135–145)

## 2016-10-13 LAB — VITAMIN B12: Vitamin B-12: 1300 pg/mL — ABNORMAL HIGH (ref 211–911)

## 2016-10-13 LAB — URIC ACID: URIC ACID, SERUM: 4.3 mg/dL (ref 2.4–7.0)

## 2016-10-13 LAB — VITAMIN D 25 HYDROXY (VIT D DEFICIENCY, FRACTURES): VITD: 20.68 ng/mL — ABNORMAL LOW (ref 30.00–100.00)

## 2016-10-13 LAB — T3, FREE: T3 FREE: 4.1 pg/mL (ref 2.3–4.2)

## 2016-10-13 LAB — SEDIMENTATION RATE: SED RATE: 16 mm/h (ref 0–20)

## 2016-10-13 LAB — TSH: TSH: 0.63 u[IU]/mL (ref 0.35–4.50)

## 2016-10-13 LAB — T4, FREE: Free T4: 0.97 ng/dL (ref 0.60–1.60)

## 2016-10-13 LAB — CORTISOL: CORTISOL PLASMA: 13.9 ug/dL

## 2016-10-13 MED ORDER — CEPHALEXIN 500 MG PO CAPS: 500.0000 mg | ORAL_CAPSULE | Freq: Three times a day (TID) | ORAL | 0 refills | Status: DC

## 2016-10-13 MED ORDER — VITAMIN D (ERGOCALCIFEROL) 1.25 MG (50000 UNIT) PO CAPS: 50000.0000 [IU] | ORAL_CAPSULE | ORAL | 0 refills | Status: DC

## 2016-10-13 NOTE — Telephone Encounter (Signed)
Pt stated that she is having UTI symptoms such as frequency and burning during urination. Please advise.

## 2016-10-13 NOTE — Addendum Note (Signed)
Addended by: Corwin Levins on: 10/13/2016 01:24 PM   Modules accepted: Orders

## 2016-10-13 NOTE — Telephone Encounter (Signed)
Ok for antibx - done erx 

## 2016-10-13 NOTE — Telephone Encounter (Signed)
Pt has been notified.

## 2016-10-13 NOTE — Assessment & Plan Note (Signed)
Sacroiliac Joint Mobilization and Rehab 1. Work on pretzel stretching, shoulder back and leg draped in front. 3-5 sets, 30 sec.. 2. hip abductor rotations. standing, hip flexion and rotation outward then inward. 3 sets, 15 reps. when can do comfortably, add ankle weights starting at 2 pounds.  3. cross over stretching - shoulder back to ground, same side leg crossover. 3-5 sets for 30 min..  4. rolling up and back knees to chest and rocking. 5. sacral tilt - 5 sets, hold for 5-10 seconds RTC in 3 weeks.

## 2016-10-13 NOTE — Assessment & Plan Note (Signed)
Patient is having some polyarthralgia. Patient does have some swelling of the PIP joints of the hands bilaterally. Family medical history is significant for rheumatoid arthritis in her mother and grandmother. I do feel that laboratory workup for autoimmune diseases possible. Patient pregnancy could've exacerbated the underlying condition. In addition of this things such as sheehan syndrome also within the differential. We will await labs. Started once weekly vitamin D secondary to muscle strength and conditioning. We warned of potential side effects patient is breast-feeding but I do think it will be safe in the individual. We discussed avoiding certain over-the-counter medications. We discussed which ones could be held full. Patient and will come back and see me again in 2-3 weeks to see how patient is responding to conservative therapy.

## 2016-10-13 NOTE — Progress Notes (Signed)
Regina Fox [address] [place], [place] [address] Phone: [phone] Subjective:    I'm seeing this patient by the request  of:    CC: Left sided back pain  [id number]  Regina Fox is a 36 y.o. female coming in with complaint of left-sided back pain. 3 months ago the patient experiencing pain all over her body. She has numbness and tingling in her fingers.Patient states that this can become swelling of multiple different joints. Sometimes it seems to be her hands, sometimes seems to be her ankles. States that she has a chronic dull throbbing aching pain with the numbness in the middle of her back as well. Eyes any weakness but states that she seems to fatigue regularly. Wants to check on vitamin D, Vitamin B12.  Onset- 3 months ago Location- Shoulders and lower back  Duration- Worse at night  Character- Sore  Aggravating factors- Sitting, working, stairs  Reliving factors-  Therapies tried- Ibuprofen Severity-Can be severe enough that seems to be incapacitating.     Past Medical History:  Diagnosis Date  . Allergic rhinitis, cause unspecified 07/18/2010  . RASH-NONVESICULAR 10/01/2009  . URI 08/22/2007  . Vitamin D deficiency 02/15/2012   Past Surgical History:  Procedure Laterality Date  . Denied Surgical Hx    . ECTOPIC PREGNANCY SURGERY    . LAPAROSCOPY N/A 06/14/2012   Procedure: LAPAROSCOPY OPERATIVE;  Surgeon: Freddrick March. Tenny Craw, MD;  Location: [facility];  Service: Gynecology;  Laterality: N/A;   Social History   Social History  . Marital status: Married    Spouse name: N/A  . Number of children: N/A  . Years of education: N/A   Occupational History  . 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 Smoking status: Never Smoker  . Smokeless tobacco: Never Used  . Alcohol use No  . Drug use: No  . Sexual activity: Yes   Other Topics Concern  . Not on file   Social History Narrative   Married.   1 child   emigrated from [place] 2005   Work: homemaker   No Known Allergies No family history on file. Family history of rheumatological disease in mother   Past medical history, social, surgical and family history all reviewed in electronic medical record.  No pertanent information unless stated regarding to the chief complaint.   Review of Systems:Review of systems updated and as accurate as of 10/13/16  No visual changes, nausea, vomiting, diarrhea, constipation, dizziness, abdominal pain, skin rash, fevers, chills, night sweats, weight loss, swollen lymph nodes, , joint swelling, chest pain, shortness of breath, mood changes. Positive headaches, abdominal pain, muscle aches, body aches  Objective  Last menstrual period 09/26/2015, unknown if currently breastfeeding. Systems examined below as of 10/13/16   General: No apparent distress alert and oriented x3 mood and affect normal, dressed appropriately.  HEENT: Pupils equal, extraocular movements intact  Respiratory: Patient's speak in full sentences and does not appear short of breath  Cardiovascular: No lower extremity edema, non tender, no erythema  Skin: Warm dry intact with no signs of infection or rash on extremities or on axial skeleton.  Abdomen: Soft nontender  Neuro: Cranial nerves II through XII are intact, neurovascularly intact in all extremities with 2+ DTRs and 2+ pulses.  Lymph: No lymphadenopathy of posterior or anterior cervical chain or axillae bilaterally.  Gait normal with good balance and coordination.  MSK:  Diffuse tender with full  range of motion and good stability and symmetric strength and tone of shoulders, elbows, wrist, hip, knee and ankles bilaterally. Hand exam shows that patient does have swelling of the PIP joints bilaterally Back Exam:  Inspection: Mild increase in lordosis Motion: Flexion 35 deg, Extension 35 deg, Side Bending to 35 deg bilaterally,  Rotation to 45 deg bilaterally  SLR laying: Negative    XSLR laying: Negative  Palpable tenderness: Tender to palpation in the paraspinal musculature lumbar spine. FABER: Mild tightness bilaterally. Sensory change: Gross sensation intact to all lumbar and sacral dermatomes.  Reflexes: 2+ at both patellar tendons, 2+ at achilles tendons, Babinski's downgoing.  Strength at foot  Plantar-flexion: 5/5 Dorsi-flexion: 5/5 Eversion: 5/5 Inversion: 5/5  Leg strength  Quad: 5/5 Hamstring: 5/5 Hip flexor: 5/5 Hip abductors: 4/5 but symmetric Gait unremarkable.  Procedure 97110; 15 additional minutes spent for Therapeutic exercises as stated in above notes.  This included exercises focusing on stretching, strengthening, with significant focus on eccentric aspects.   Long term goals include an improvement in range of motion, strength, endurance as well as avoiding reinjury. Patient's frequency would include in 1-2 times a day, 3-5 times a week for a duration of 6-12 weeks. Low back exercises that included:  Pelvic tilt/bracing instruction to focus on control of the pelvic girdle and lower abdominal muscles  Glute strengthening exercises, focusing on proper firing of the glutes without engaging the low back muscles Proper stretching techniques for maximum relief for the hamstrings, hip flexors, low back and some rotation where tolerated   Proper technique shown and discussed handout in great detail with ATC.  All questions were discussed and answered.      Impression and Recommendations:     This case required medical decision making of moderate complexity.      Note: This dictation was prepared with Dragon dictation along with smaller phrase technology. Any transcriptional errors that result from this process are unintentional.

## 2016-10-13 NOTE — Patient Instructions (Signed)
Good to see you  I think you will do great  In the logn run but may take some time.  Ice 20 minutes 2 times daily. Usually after activity and before bed. pennsaid pinkie amount topically 2 times daily as needed. [facility] right afterward before you pick up the little one.  Go down stairs and get labs Once weekly vitamin D that should help and should even help the little one  Exercises 3 times a week.  Over the counter Turmeric  daily but watch for baby on the change of taste or quantity of breast milk.  If you notice baby not eating as much please stop.  See me again in 2-3 weeks.

## 2016-10-13 NOTE — Telephone Encounter (Signed)
-----   Message from Corwin Levins, MD sent at 10/13/2016  1:08 PM EDT ----- Left message on MyChart, pt to cont same tx except  The test results show that your current treatment is OK, except the Vitamin D level is low, and the urinalysis has a slight increased white blood cells, which only in certain circumstances would mean an infection.  I will ask the office to call you and ask you to take Vitamin D3 2000 units per day (OTC) indefinitely, and ask if you are having any symptoms to suggest a urinary tract infection.    Shirron to please inform pt, and let me know if she believes she has UTI symptoms such as fever, dysuria, frequency, abd pain or other pain

## 2016-10-16 LAB — ANA: Anti Nuclear Antibody(ANA): NEGATIVE

## 2016-10-16 LAB — CYCLIC CITRUL PEPTIDE ANTIBODY, IGG

## 2016-10-16 LAB — PTH, INTACT AND CALCIUM
Calcium: 9.5 mg/dL (ref 8.6–10.2)
PTH: 32 pg/mL (ref 14–64)

## 2016-10-16 LAB — RHEUMATOID FACTOR: Rhuematoid fact SerPl-aCnc: <14 IU/mL (ref <14)

## 2016-10-16 LAB — DHEA: DHEA: 468 ng/dL (ref 102–1185)

## 2016-10-16 LAB — CALCIUM, IONIZED: Calcium, Ion: 5.2 mg/dL (ref 4.8–5.6)

## 2016-10-19 ENCOUNTER — Ambulatory Visit (INDEPENDENT_AMBULATORY_CARE_PROVIDER_SITE_OTHER): Payer: BC Managed Care – PPO | Admitting: Internal Medicine

## 2016-10-19 ENCOUNTER — Telehealth: Payer: Self-pay | Admitting: Internal Medicine

## 2016-10-19 ENCOUNTER — Encounter: Payer: Self-pay | Admitting: Internal Medicine

## 2016-10-19 VITALS — BP 124/74 | HR 87 | Temp 98.2°F | Ht 62.0 in | Wt 176.0 lb

## 2016-10-19 DIAGNOSIS — K219 Gastro-esophageal reflux disease without esophagitis: Secondary | ICD-10-CM

## 2016-10-19 DIAGNOSIS — N3000 Acute cystitis without hematuria: Secondary | ICD-10-CM

## 2016-10-19 DIAGNOSIS — M2550 Pain in unspecified joint: Secondary | ICD-10-CM | POA: Diagnosis not present

## 2016-10-19 DIAGNOSIS — R208 Other disturbances of skin sensation: Secondary | ICD-10-CM | POA: Diagnosis not present

## 2016-10-19 DIAGNOSIS — N390 Urinary tract infection, site not specified: Secondary | ICD-10-CM | POA: Insufficient documentation

## 2016-10-19 MED ORDER — PANTOPRAZOLE SODIUM 40 MG PO TBEC: 40.0000 mg | DELAYED_RELEASE_TABLET | Freq: Every day | ORAL | 3 refills | Status: AC

## 2016-10-19 MED ORDER — PREDNISONE 10 MG PO TABS: ORAL_TABLET | ORAL | 0 refills | Status: DC

## 2016-10-19 NOTE — Patient Instructions (Signed)
Please take all new medication as prescribed - the protonix for acid reflux, and the prednisone trial  Please continue all other medications as before, and refills have been done if requested.  Please have the pharmacy call with any other refills you may need.  Please keep your appointments with your specialists as you may have planned  You will be contacted regarding the referral for: Rheumatology, and Neurology

## 2016-10-19 NOTE — Progress Notes (Signed)
Subjective:    Patient ID: Regina Fox, female    DOB: February 05, 1980, 36 y.o.   MRN: [id number]  HPI  Here with vague symptoms of body burning pain all over, HA more on the left head, and multiple joint pains to hand and feet primarily worse since last seen.  Has been under quite a bit of stress with new baby, now has total 4 children and husband, Is currently breastfeeding and concerned about any kind of tx.  Has had mult labs recently neg as below.  Pt denies fever, wt loss, night sweats, loss of appetite, or other constitutional symptoms  Pt denies chest pain, increased sob or doe, wheezing, orthopnea, PND, palpitations, dizziness or syncope, but has had some puffiness of the hand and feet/ankles as well.  Also has had mild worsening reflux with nausea, but no abd pain, dysphagia, vomiting, bowel change or blood.  Denies urinary symptoms such as dysuria, frequency, urgency, flank pain, hematuria or n/v, fever, chills.  Did have recent UTI now finishing keflex tx Past Medical History:  Diagnosis Date  . Allergic rhinitis, cause unspecified 07/18/2010  . RASH-NONVESICULAR 10/01/2009  . URI 08/22/2007  . Vitamin D deficiency 02/15/2012   Past Surgical History:  Procedure Laterality Date  . Denied Surgical Hx    . ECTOPIC PREGNANCY SURGERY    . LAPAROSCOPY N/A 06/14/2012   Procedure: LAPAROSCOPY OPERATIVE;  Surgeon: Freddrick March. Tenny Craw, MD;  Location: [facility];  Service: Gynecology;  Laterality: N/A;    reports that she has never smoked. She has never used smokeless tobacco. She reports that she does not drink alcohol or use drugs. family history is not on file. No Known Allergies Current Outpatient Prescriptions on File Prior to Visit  Medication Sig Dispense Refill  . calcium carbonate (TUMS - DOSED IN MG ELEMENTAL CALCIUM) 500 MG chewable tablet Chew 1 tablet by mouth daily.    . cetirizine (ZYRTEC) 10 MG tablet Take 1 tablet (10 mg total) by mouth daily. 30 tablet 11  . cyclobenzaprine (FLEXERIL) 5 MG  tablet Take 1 tablet (5 mg total) by mouth 3 (three) times daily as needed for muscle spasms. 30 tablet 1  . Prenatal Vit-Fe Fumarate-FA (PRENATAL MULTIVITAMIN) TABS Take 1 tablet by mouth daily at 12 noon.    . Vitamin D, Ergocalciferol, (DRISDOL) 50000 units CAPS capsule Take 1 capsule (50,000 Units total) by mouth every 7 (seven) days. 12 capsule 0   No current facility-administered medications on file prior to visit.    Review of Systems  Constitutional: Negative for other unusual diaphoresis or sweats HENT: Negative for ear discharge or swelling Eyes: Negative for other worsening visual disturbances Respiratory: Negative for stridor or other swelling  Gastrointestinal: Negative for worsening distension or other blood Genitourinary: Negative for retention or other urinary change Musculoskeletal: Negative for other MSK pain or swelling Skin: Negative for color change or other new lesions Neurological: Negative for worsening tremors and other numbness  Psychiatric/Behavioral: Negative for worsening agitation or other fatigue All other system neg per pt    Objective:   Physical Exam BP 124/74   Pulse 87   Temp 98.2 F (36.8 C) (Oral)   Ht  (1.575 m)   Wt 176 lb (79.8 kg)   LMP 09/26/2015 (LMP Unknown) Comment: on IUD after delivery   SpO2 100%   BMI 32.19 kg/m  VS noted, not ill appearing Constitutional: Pt appears in NAD HENT: Head: NCAT.  Right Ear: External ear normal.  Left Ear: External  ear normal.  Eyes: . Pupils are equal, round, and reactive to light. Conjunctivae and EOM are normal Nose: without d/c or deformity Neck: Neck supple. Gross normal ROM Cardiovascular: Normal rate and regular rhythm.   Pulmonary/Chest: Effort normal and breath sounds without rales or wheezing.  Abd:  Soft, NT, ND, + BS, no organomegaly, no flank tender Neurological: Pt is alert. At baseline orientation, motor 5/5 intact, sens intact but seems "tender all over" Skin: Skin is warm.  No rashes, other new lesions, has bilat fingers puffy it seems and bilat trace ankle LE edema No joint effusions Psychiatric: Pt behavior is normal without agitation , makred nervous No other exam findings  Recent Labs: Cyclic Citrullin Peptide Ab UNITS <16    Rhuematoid fact SerPl-aCnc <14 IU/mL <14    Sed Rate 0 - 20 mm/hr 16    Anit Nuclear Antibody(ANA) NEGATIVE NEGATIVE    Vitamin B-12 211 - 911 pg/mL 1,300     VITAMIN D 25 Hydroxy (Vit-D Deficiency, Fractures)   Ref Range & Units 6d ago  VITD 30.00 - 100.00 ng/mL 20.68        IBC panel   Ref Range & Units 6d ago  Iron 42 - 145 ug/dL 409   Transferrin 811.9 - 360.0 mg/dL 147.8   Saturation Ratios 20.0 - 50.0 % 36.2        UA Color, Urine Yellow;Lt. Yellow YELLOW  LT. YELLOW  LT. YELLOW  LT. YELLOW     APPearance Clear CLEAR  CLEAR  CLEAR  CLEAR    Specific Gravity, Urine 1.000 - 1.030 >=1.030   >=1.030  1.025  >=1.030    pH 5.0 - 8.0 6.0  5.5  6.0  5.0    Total Protein, Urine Negative NEGATIVE  NEGATIVE  NEGATIVE  NEGATIVER    Urine Glucose Negative NEGATIVE  NEGATIVE  NEGATIVE  NEGATIVER    Ketones, ur Negative NEGATIVE  NEGATIVE  NEGATIVE  NEGATIVER    Bilirubin Urine Negative NEGATIVE  NEGATIVE  NEGATIVE  NEGATIVE    Hgb urine dipstick Negative NEGATIVE  NEGATIVE  NEGATIVE     Urobilinogen, UA 0.0 - 1.0 0.2  0.2  0.2  0.2    Leukocytes, UA Negative MODERATE   NEGATIVE  TRACE  MODERATE    Nitrite Negative NEGATIVE  NEGATIVE  NEGATIVE  NEGATIVE    WBC, UA 0-2/hpf 7-10/hpf   0-2/hpf  3-6/hpf  11-20/hpfR, CM    RBC / HPF 0-2/hpf 0-2/hpf   0-2/hpf  0-2/hpf    Squamous Epithelial / LPF Rare(0-4/hpf) Few(5-10/hpf)   Rare(0-4/hpf)  Rare(0-4/hpf)  Many(>10/hpf)R    Bacteria, UA None Few(10-50/hpf)        Lab Results  Component Value Date   WBC 6.1 10/13/2016   HGB 13.3 10/13/2016   HCT 40.1 10/13/2016   PLT 233.0 10/13/2016   GLUCOSE 101 (H) 10/13/2016   CHOL 180 10/13/2016   TRIG 54.0 10/13/2016   HDL 51.00  10/13/2016   LDLCALC 119 (H) 10/13/2016   ALT 16 10/13/2016   AST 17 10/13/2016   NA 136 10/13/2016   K 3.5 10/13/2016   CL 102 10/13/2016   CREATININE 0.63 10/13/2016   BUN 10 10/13/2016   CO2 27 10/13/2016   TSH 0.63 10/13/2016   Uric Acid, Serum 2.4 - 7.0 mg/dL 4.3       Assessment & Plan:

## 2016-10-19 NOTE — Assessment & Plan Note (Signed)
Mild, for PPI,  to f/u any worsening symptoms or concerns

## 2016-10-19 NOTE — Assessment & Plan Note (Signed)
Recent lab evaluation neg, ? FMS - for rheumatology referral,  to f/u any worsening symptoms or concerns

## 2016-10-19 NOTE — Telephone Encounter (Signed)
Patient Name: Regina Fox  DOB: July 12, 1980    Initial Comment Caller states she wants to make an appointment today if possible. She has a burning pain all over her body. Her B12 is too high.    Nurse Assessment  Nurse: Stefano Gaul, RN, Dwana Curd Date/Time (Eastern Time): 10/19/2016 8:05:16 AM  Confirm and document reason for call. If symptomatic, describe symptoms. ---Caller states she has a burning pain all over. Her B12 was elevated. Did not sleep at all. Pain in feet and arms. Swelling in both hands and ankles.  Does the patient have any new or worsening symptoms? ---Yes  Will a triage be completed? ---Yes  Related visit to physician within the last 2 weeks? ---No  Does the PT have any chronic conditions? (i.e. diabetes, asthma, etc.) ---No  Is the patient pregnant or possibly pregnant? (Ask all females between the ages of 65-55) ---No  Is this a behavioral health or substance abuse call? ---No     Guidelines    Guideline Title Affirmed Question Affirmed Notes  Ankle Swelling [1] SEVERE pain (e.g., excruciating, unable to walk) AND [2] not improved after 2 hours of pain medicine    Final Disposition User   See Physician within 4 Hours (or PCP triage) Stefano Gaul, RN, Vera    Comments  appt scheduled for 10/19/16 at 11: 15 am with Dr. Oliver Barre   Referrals  REFERRED TO PCP OFFICE   Caller Disagree/Comply Comply  Caller Understands Yes  PreDisposition Call Doctor

## 2016-10-19 NOTE — Telephone Encounter (Signed)
Patient is already scheduled today with [name] - have added notes into visit.

## 2016-10-19 NOTE — Assessment & Plan Note (Addendum)
.  Mild, improved, to cont antibx course,  to f/u any worsening symptoms or concerns

## 2016-10-19 NOTE — Assessment & Plan Note (Signed)
Etiology unclear, for neurology referal

## 2016-10-20 ENCOUNTER — Encounter: Payer: Self-pay | Admitting: Neurology

## 2016-10-22 ENCOUNTER — Encounter (HOSPITAL_BASED_OUTPATIENT_CLINIC_OR_DEPARTMENT_OTHER): Payer: Self-pay

## 2016-10-22 ENCOUNTER — Emergency Department (HOSPITAL_BASED_OUTPATIENT_CLINIC_OR_DEPARTMENT_OTHER)
Admission: EM | Admit: 2016-10-22 | Discharge: 2016-10-22 | Disposition: A | Discharge status: Home / Self Care | Payer: BC Managed Care – PPO | Attending: Emergency Medicine | Admitting: Emergency Medicine

## 2016-10-22 ENCOUNTER — Emergency Department (HOSPITAL_BASED_OUTPATIENT_CLINIC_OR_DEPARTMENT_OTHER): Payer: BC Managed Care – PPO

## 2016-10-22 DIAGNOSIS — Z79899 Other long term (current) drug therapy: Secondary | ICD-10-CM | POA: Diagnosis not present

## 2016-10-22 DIAGNOSIS — E876 Hypokalemia: Secondary | ICD-10-CM

## 2016-10-22 DIAGNOSIS — R0789 Other chest pain: Secondary | ICD-10-CM | POA: Insufficient documentation

## 2016-10-22 DIAGNOSIS — R200 Anesthesia of skin: Secondary | ICD-10-CM

## 2016-10-22 DIAGNOSIS — R202 Paresthesia of skin: Secondary | ICD-10-CM | POA: Diagnosis not present

## 2016-10-22 LAB — BASIC METABOLIC PANEL
ANION GAP: 8 (ref 5–15)
BUN: 12 mg/dL (ref 6–20)
CHLORIDE: 102 mmol/L (ref 101–111)
CO2: 26 mmol/L (ref 22–32)
Calcium: 10 mg/dL (ref 8.9–10.3)
Creatinine, Ser: 0.69 mg/dL (ref 0.44–1.00)
GFR calc non Af Amer: >60 mL/min (ref >60)
Glucose, Bld: 94 mg/dL (ref 65–99)
POTASSIUM: 3.3 mmol/L — AB (ref 3.5–5.1)
Sodium: 136 mmol/L (ref 135–145)

## 2016-10-22 LAB — CBC
HEMATOCRIT: 40.2 % (ref 36.0–46.0)
HEMOGLOBIN: 13.8 g/dL (ref 12.0–15.0)
MCH: 29.4 pg (ref 26.0–34.0)
MCHC: 34.3 g/dL (ref 30.0–36.0)
MCV: 85.5 fL (ref 78.0–100.0)
Platelets: 249 10*3/uL (ref 150–400)
RBC: 4.7 MIL/uL (ref 3.87–5.11)
RDW: 12.9 % (ref 11.5–15.5)
WBC: 8.8 10*3/uL (ref 4.0–10.5)

## 2016-10-22 LAB — TROPONIN I: Troponin I: <0.03 ng/mL (ref <0.03)

## 2016-10-22 MED ORDER — POTASSIUM CHLORIDE ER 10 MEQ PO TBCR: 10.0000 meq | EXTENDED_RELEASE_TABLET | Freq: Every day | ORAL | 0 refills | Status: AC

## 2016-10-22 NOTE — Discharge Instructions (Signed)
Take prednisone as prescribed by your primary care doctor. Take potassium pill daily. Make sure that you stay well-hydrated. This is very important for muscle cramps. Continue to take the antibiotic for UTI. Follow-up with your primary care doctor in 1 week if your symptoms are not improving. Return to the emergency room if you develop vision changes, loss of bowel or bladder control, inability to move your arm or leg, or any new or worsening symptoms.

## 2016-10-22 NOTE — ED Triage Notes (Addendum)
Pt reports dizziness, headache, and chest pain since 10am. She left arm numbness x10 days, that has been evaluated by PCP and Dx with UTI. Postpartum with 18 month old baby, pt concerned that this is related to epidural. On Keflex for UTI. Currently breastfeeding.

## 2016-10-22 NOTE — ED Notes (Signed)
ED Provider at bedside. 

## 2016-10-22 NOTE — ED Notes (Signed)
Pt given d/c instructions as per chart. Rx x 1. Verbalizes understanding. No questions. 

## 2016-10-22 NOTE — ED Provider Notes (Signed)
[facility] Provider Note   CSN: [id number] Arrival date & time: 10/22/16  1723     History   Chief Complaint Chief Complaint  Patient presents with  . Chest Pain    HPI Regina Fox is a 36 y.o. female presenting with complaints of numbness and tingling of left arm and left-sided face with associated left shoulder pain.  Patient states that she has had issues of numbness since the birth of her child 3 months ago. She received an epidural at this time, and states that she has had pain with left leg numbness ever since. Additionally, patient states that 2 weeks ago she started to develop left shoulder pain with numbness of the arm and face. This numbness has been spreading, and comes up into her head. She's been evaluated by her primary care doctor for this, and a neurology appointment has been made for January. She has tried Tylenol and ibuprofen without relief of symptoms. When asked, patient clarifies the sensation as a tingling, not a complete numbness. She is able to move her arm and hand without difficulty. Symptoms are intermittent. They can be produced by movement of her head towards her chest, and relieved when head is in a neutral position. On further history, patient states she was prescribed prednisone by her primary care doctor, but she has not taken this, as she is breast-feeding and is concerned that it can cause problems with her child. Additionally, she was given a muscle relaxer, but has not taken this either. She denies fevers, chills, vision changes, slurred speech, shortness of breath, chest pain, nausea, vomiting, abdominal pain, urinary symptoms, or abnormal bowel movements. She is finishing Keflex for her UTI. She is ambulatory without difficulty.  Additionally, patient reporting intermittent muscle cramps, worse at night. She states she is not drinking very much water, as she is taking care of the baby, not taking care of herself. She reports normal  appetite.  HPI  Past Medical History:  Diagnosis Date  . Allergic rhinitis, cause unspecified 07/18/2010  . RASH-NONVESICULAR 10/01/2009  . URI 08/22/2007  . Vitamin D deficiency 02/15/2012    Patient Active Problem List   Diagnosis Date Noted  . GERD (gastroesophageal reflux disease) 10/19/2016  . Dysesthesia of multiple sites 10/19/2016  . UTI (urinary tract infection) 10/19/2016  . Polyarthralgia 10/13/2016  . 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 Trapezius strain, unspecified laterality, initial encounter 10/01/2016  . Left sided sciatica 09/26/2016  . Chest pain 09/26/2016  . Iron deficiency anemia 09/26/2016  . Fetal abnormality affecting management of mother 03/21/2016  . Normal pregnancy 06/14/2013  . Vitamin D deficiency 02/15/2012  . Allergic rhinitis 07/18/2010  . Encounter for well adult exam with abnormal findings 05/23/2010    Past Surgical History:  Procedure Laterality Date  . Denied Surgical Hx    . ECTOPIC PREGNANCY SURGERY    . LAPAROSCOPY N/A 06/14/2012   Procedure: LAPAROSCOPY OPERATIVE;  Surgeon: Freddrick March. Tenny Craw, MD;  Location: [facility];  Service: Gynecology;  Laterality: N/A;    OB History    Gravida Para Term Preterm AB Living   SAB TAB Ectopic Multiple Live Births   Home Medications    Prior to Admission medications   Medication Sig Start Date End Date Taking? Authorizing Provider  Cephalexin (KEFLEX PO) Take by mouth.   Yes [provider]  calcium carbonate (  TUMS - DOSED IN MG ELEMENTAL CALCIUM) 500 MG chewable tablet Chew 1 tablet by mouth daily.    [provider]  cetirizine (ZYRTEC) 10 MG tablet Take 1 tablet (10 mg total) by mouth daily. 09/26/16 09/26/17  Corwin Levins, MD  cyclobenzaprine (FLEXERIL) 5 MG tablet Take 1 tablet (5 mg total) by mouth 3 (three) times daily as needed for muscle spasms. 09/26/16   Corwin Levins, MD  pantoprazole (PROTONIX) 40 MG tablet Take 1 tablet (40 mg  total) by mouth daily. 10/19/16   Corwin Levins, MD  potassium chloride (K-DUR) 10 MEQ tablet Take 1 tablet (10 mEq total) by mouth daily. 10/22/16   Breckin Savannah, PA-C  predniSONE (DELTASONE) 10 MG tablet 3 tabs by mouth per day for 3 days,1tabs per day for 3 days,1tab per day for 3 days 10/19/16   Corwin Levins, MD  Prenatal Vit-Fe Fumarate-FA (PRENATAL MULTIVITAMIN) TABS Take 1 tablet by mouth daily at 12 noon.    [provider]  Vitamin D, Ergocalciferol, (DRISDOL) 50000 units CAPS capsule Take 1 capsule (50,000 Units total) by mouth every 7 (seven) days. 10/13/16   Judi Saa, DO    Family History History reviewed. No pertinent family history.  Social History Social History  Substance Use Topics  . Smoking status: Never Smoker  . Smokeless tobacco: Never Used  . Alcohol use No     Allergies   Patient has no known allergies.   Review of Systems Review of Systems  Musculoskeletal: Negative for gait problem.  Neurological: Positive for numbness (Intermittent tingling). Negative for weakness.     Physical Exam Updated Vital Signs BP 100/61 (BP Location: Left Arm)   Pulse 80   Temp 97.9 F (36.6 C) (Oral)   Resp 18   Ht  (1.575 m)   Wt 79.8 kg (176 lb)   LMP 10/21/2016 Comment: on IUD after delivery   SpO2 98%   Breastfeeding? Yes   BMI 32.19 kg/m   Physical Exam  Constitutional: She is oriented to person, place, and time. She appears well-developed and well-nourished. No distress.  HENT:  Head: Normocephalic and atraumatic.  Right Ear: Tympanic membrane, external ear and ear canal normal.  Left Ear: Tympanic membrane, external ear and ear canal normal.  Nose: Nose normal.  Mouth/Throat: Uvula is midline, oropharynx is clear and moist and mucous membranes are normal.  Eyes: Pupils are equal, round, and reactive to light. Conjunctivae and EOM are normal.  Neck: Normal range of motion.  No tenderness to palpation of cervical spine. Patient  reports mild tenderness and worsening tingling with palpation of left shoulder  Cardiovascular: Normal rate, regular rhythm and intact distal pulses.   Pulmonary/Chest: Effort normal and breath sounds normal. No respiratory distress. She has no wheezes.  Abdominal: Soft. She exhibits no distension. There is no tenderness. There is no guarding.  Musculoskeletal: Normal range of motion.  No tenderness to palpation of midline back or back musculature. Strength intact 4. Sensation intact 4. Radial and pedal pulses intact bilaterally. Soft compartments. Patient with full active range of motion of all extremities. Ambulatory without difficulty.  Neurological: She is alert and oriented to person, place, and time. She has normal strength. No cranial nerve deficit or sensory deficit. She displays a negative Romberg sign. GCS eye subscore is 4. GCS verbal subscore is 5. GCS motor subscore is 6.  Fine movement and coordination intact  Skin: Skin is warm and dry.  Psychiatric: Her mood appears  anxious.  Nursing note and vitals reviewed.    ED Treatments / Results  Labs (all labs ordered are listed, but only abnormal results are displayed) Labs Reviewed  BASIC METABOLIC PANEL - Abnormal; Notable for the following:       Result Value   Potassium 3.3 (*)    All other components within normal limits  CBC  TROPONIN I    EKG  EKG Interpretation  Date/Time:  Sunday October 22 2016 17:36:30 EDT Ventricular Rate:  83 PR Interval:  132 QRS Duration: 84 QT Interval:  354 QTC Calculation: 415 R Axis:   79 Text Interpretation:  Normal sinus rhythm Normal ECG Confirmed by Cathren Laine ([id number]) on 10/22/2016 5:53:16 PM       Radiology Dg Chest 2 View  Result Date: 10/22/2016 CLINICAL DATA:  Chest pain. Left arm numbness. Three months postpartum EXAM: CHEST  2 VIEW COMPARISON:  Two-view chest x-ray 09/26/2016. FINDINGS: The heart size and mediastinal contours are within normal limits. Both lungs are  clear. The visualized skeletal structures are unremarkable. IMPRESSION: Negative two view chest x-ray Electronically Signed   By: Marin Roberts M.D.   On: 10/22/2016 18:26    Procedures Procedures (including critical care time)  Medications Ordered in ED Medications - No data to display   Initial Impression / Assessment and Plan / ED Course  I have reviewed the triage vital signs and the nursing notes.  Pertinent labs & imaging results that were available during my care of the patient were reviewed by me and considered in my medical decision making (see chart for details).     Patient presenting with worsening tingling of left arm, left cheek, and head. Symptoms are worse when she looks down, improved her head is in a neutral position. She's been evaluated by her primary care physician for this, placed on prednisone. She has not started taking this, she is concerned that it is not safe during breast-feeding. Physical exam reassuring, as patient without obvious neurologic deficits. Basic labs show slight hypokalemia at 3.3. Negative troponin. X-ray negative. EKG reassuring. Discussed with patient that short courses of prednisone is usually safe in breast-feeding. Will prescribe potassium supplement, and patient to take prednisone as prescribed. Patient to follow-up with primary care if symptoms are not improving. At this time, patient appears safe for discharge. Return precautions given. Patient states she understands and agrees to plan.   Final Clinical Impressions(s) / ED Diagnoses   Final diagnoses:  Numbness  Hypokalemia    New Prescriptions Discharge Medication List as of 10/22/2016  8:17 PM    START taking these medications   Details  potassium chloride (K-DUR) 10 MEQ tablet Take 1 tablet (10 mEq total) by mouth daily., Starting Sun 10/22/2016, Print         Cassady Turano, PA-C 10/23/16 [id number]    Cathren Laine, MD 10/23/16 [id number]

## 2016-10-23 ENCOUNTER — Telehealth: Payer: Self-pay | Admitting: Internal Medicine

## 2016-10-23 NOTE — Telephone Encounter (Signed)
FYI:  Patient called in stating that she had to go to the ED for similar reasons she came in to see Dr. [name] for.  Patient requesting to get in sooner with neurologist. Lawson Fiscal is going to check with [facility] to see if patient can get seen sooner there than with [facility].  Lawson Fiscal is going to work rheumatologist referral today as well for patient.   I did inform patient that on her ED AVS it states that if she was not feeling any  Better to come in for an OV with Dr. [name].

## 2016-10-27 ENCOUNTER — Encounter: Payer: Self-pay | Admitting: Family Medicine

## 2016-10-27 ENCOUNTER — Ambulatory Visit (INDEPENDENT_AMBULATORY_CARE_PROVIDER_SITE_OTHER): Payer: BC Managed Care – PPO | Admitting: Family Medicine

## 2016-10-27 DIAGNOSIS — R208 Other disturbances of skin sensation: Secondary | ICD-10-CM

## 2016-10-27 NOTE — Assessment & Plan Note (Signed)
Patient states that since then on the prednisone seems to be improving. Patient's workup has been fairly unremarkable except for vitamin D deficiency. Recently has had a pregnancy as well as some type of viral infection as well as patient being on some antibiotics. Patient thinks bee stings cause some type of insult that is giving her some of the discomfort. I do feel that we have ruled out most autoimmune diseases except for multiple sclerosis where I do feel that neurology would be beneficial to help with 2 further testing if necessary. I would like their evaluation and input. We discussed at this point I do not think I would change medical management. We discussed high doses of prednisone but I would like to avoid that while she is still breast-feeding.

## 2016-10-27 NOTE — Progress Notes (Signed)
[facility] [address] [place], [place] [address] Phone: [phone] Subjective:     CC: Polyarthralgia follow-up  BJY:[id number]  Regina Fox is a 36 y.o. female coming in with complaint of pain all over. Patient was having signs and symptoms consistent with a polyarthralgia then seemed to be exacerbated by patient's recent pregnancy. Has been nearly 4 months since the delivery. Continued to have significant amount of pain and polyarthralgia. Send patient for laboratory workup and was found to have significantly low vitamin D levels, but otherwise workup was fairly unremarkable. Patient unfortunately continues to have significant numbness that seems to go down the upper extremities from 10 times and then seems to go down the lower extremity as well. Sometimes associated with weakness. Seems to be somewhat intermittent. Patient's on primary care provider and was given prednisone and has helped. Patient though is still very concerned that there is something else underlying area.  Patient has been referred to neurology but is having difficulty getting in. Patient would like to be seen before her daughter has open heart surgery in December.      Past Medical History:  Diagnosis Date  . Allergic rhinitis, cause unspecified 07/18/2010  . RASH-NONVESICULAR 10/01/2009  . URI 08/22/2007  . Vitamin D deficiency 02/15/2012   Past Surgical History:  Procedure Laterality Date  . Denied Surgical Hx    . ECTOPIC PREGNANCY SURGERY    . LAPAROSCOPY N/A 06/14/2012   Procedure: LAPAROSCOPY OPERATIVE;  Surgeon: Freddrick March. Tenny Craw, MD;  Location: [facility];  Service: Gynecology;  Laterality: N/A;   Social History   Social History  . Marital status: Married    Spouse name: N/A  . Number of children: N/A  . Years of education: N/A   Occupational History  . 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 Smoking status: Never Smoker  . Smokeless  tobacco: Never Used  . Alcohol use No  . Drug use: No  . Sexual activity: Yes   Other Topics Concern  . None   Social History Narrative   Married.   1 child   emigrated from [place] 2005   Work: homemaker   No Known Allergies No family history on file.   Past medical history, social, surgical and family history all reviewed in electronic medical record.  No pertanent information unless stated regarding to the chief complaint.   Review of Systems:Review of systems updated and as accurate as of 10/27/16  No visual changes, nausea, vomiting, diarrhea, constipation, dizziness, skin rash, fevers, chills, night sweats, weight loss, swollen lymph nodes, body aches, joint swelling, , chest pain, shortness of breath, mood changes. Positive headaches, muscle aches and muscle fatigue, as well as numbness of the extremities  Objective  Blood pressure 120/70, pulse 85, height  (1.575 m), weight 175 lb (79.4 kg), last menstrual period 10/21/2016, SpO2 98 %, currently breastfeeding. Systems examined below as of 10/27/16   General: No apparent distress alert and oriented x3 mood and affect normal, dressed appropriately.  HEENT: Pupils equal, extraocular movements intact  Respiratory: Patient's speak in full sentences and does not appear short of breath  Cardiovascular: No lower extremity edema, non tender, no erythema  Skin: Warm dry intact with no signs of infection or rash on extremities or on axial skeleton.  Abdomen: Soft nontender  Neuro: Cranial nerves II through XII are intact, neurovascularly intact in all extremities with 2+ DTRs and 2+ pulses.  Lymph: No lymphadenopathy of posterior or anterior cervical chain or axillae bilaterally.  Gait normal with good balance and coordination.  MSK:  Mild diffuse tender with full range of motion and good stability and symmetric strength and tone of shoulders, elbows, wrist, hip, knee and ankles bilaterally. Patient does have some mild weakness  of the proximal muscle girdle the deep tendon reflexes are intact and patient is neurovascularly intact in all extremities at this time.    Impression and Recommendations:     This case required medical decision making of moderate complexity.      Note: This dictation was prepared with Dragon dictation along with smaller phrase technology. Any transcriptional errors that result from this process are unintentional.

## 2016-11-06 ENCOUNTER — Encounter: Payer: Self-pay | Admitting: Neurology

## 2016-11-30 ENCOUNTER — Encounter: Payer: Self-pay | Admitting: Neurology

## 2016-11-30 ENCOUNTER — Telehealth: Payer: Self-pay | Admitting: Internal Medicine

## 2016-11-30 ENCOUNTER — Ambulatory Visit (INDEPENDENT_AMBULATORY_CARE_PROVIDER_SITE_OTHER): Payer: BC Managed Care – PPO | Admitting: Neurology

## 2016-11-30 VITALS — BP 108/68 | HR 79 | Ht 62.0 in | Wt 176.2 lb

## 2016-11-30 DIAGNOSIS — R208 Other disturbances of skin sensation: Secondary | ICD-10-CM | POA: Diagnosis not present

## 2016-11-30 DIAGNOSIS — G44209 Tension-type headache, unspecified, not intractable: Secondary | ICD-10-CM

## 2016-11-30 NOTE — Patient Instructions (Signed)
We will MRI brain wwo contrast Your symptoms are most likely stress-related

## 2016-11-30 NOTE — Progress Notes (Signed)
[facility] Clinic Note - Initial Visit   Date: 11/30/16  Kizzie FurnishSuheer Zeiger MRN: [id number] DOB: 06/18/1980   Dear Dr. Katrinka BlazingSmith:   Thank you for your kind referral of Kizzie FurnishSuheer Mcshan for consultation of generalized paresthesias. Although her history is well known to you, please allow us to reiterate it for the purpose of our medical record. The patient was accompanied to the clinic by husband and two children who also provides collateral information.     History of Present Illness: Kizzie FurnishSuheer Horine is a 36 y.o. right-handed female with no prior medical history presenting for evaluation of generalized paresthesias.    Starting in September, she began having left leg numbness which then involved her hands and face, worse on the left.  She has it over the chest, back, and right side also, but not as intense on her left.  Her symptoms spontaneously improved for about 2 weeks. Since starting vitamin D supplementation and prednisone, her symptoms have become less frequent. Usually, it lasts for 1-2 days and can go away for a few days and recur.  She denies any associated weakness.  She denies any vision changes.  She complains of left-sided headache, which is sharp.  She usually takes ibuprofen which relieves her headache.    She has been under a lot of stress with her last pregnancy because she was informed that the baby may not survive due to cardiac deformity.  She reports crying all the time during the pregnancy.  Even now, she is very worried about her baby as he will need cardiac surgery for Tetralogy of Fallot.  She had noticed that stress makes all of her symptoms much worse.   She also complains of "bone" pain and has had extensive evaluation for this. She will be seeing rheumatology in a few weeks.   Out-side paper records, electronic medical record, and images have been reviewed where available and summarized as:  MRI brain wwo contrast 08/03/2010:  Normal examination.   Normal morphology and enhancement characteristics of the pituitary gland.  No diagnosable pituitary lesion.  Lab Results  Component Value Date   TSH 0.63 10/13/2016   Lab Results  Component Value Date   VITAMINB12 1,300 (H) 10/13/2016   Lab Results  Component Value Date   ESRSEDRATE 16 10/13/2016    Lab Results  Component Value Date   ANA NEGATIVE 10/13/2016   RF <14 10/13/2016     Past Medical History:  Diagnosis Date  . Allergic rhinitis, cause unspecified 07/18/2010  . RASH-NONVESICULAR 10/01/2009  . URI 08/22/2007  . Vitamin D deficiency 02/15/2012    Past Surgical History:  Procedure Laterality Date  . Denied Surgical Hx    . ECTOPIC PREGNANCY SURGERY    . LAPAROSCOPY N/A 06/14/2012   Procedure: LAPAROSCOPY OPERATIVE;  Surgeon: Freddrick MarchKendra H. Tenny Crawoss, MD;  Location: [facility];  Service: Gynecology;  Laterality: N/A;     Medications:  Outpatient Encounter Medications as of 11/30/2016  Medication Sig  . calcium carbonate (TUMS - DOSED IN MG ELEMENTAL CALCIUM) 500 MG chewable tablet Chew 1 tablet by mouth daily.  . Cephalexin (KEFLEX PO) Take by mouth.  . cetirizine (ZYRTEC) 10 MG tablet Take 1 tablet (10 mg total) by mouth daily.  . cyclobenzaprine (FLEXERIL) 5 MG tablet Take 1 tablet (5 mg total) by mouth 3 (three) times daily as needed for muscle spasms.  . pantoprazole (PROTONIX) 40 MG tablet Take 1 tablet (40 mg total) by mouth daily.  . potassium chloride (K-DUR) 10  MEQ tablet Take 1 tablet (10 mEq total) by mouth daily.  . Prenatal Vit-Fe Fumarate-FA (PRENATAL MULTIVITAMIN) TABS Take 1 tablet by mouth daily at 12 noon.  . Vitamin D, Ergocalciferol, (DRISDOL) 50000 units CAPS capsule Take 1 capsule (50,000 Units total) by mouth every 7 (seven) days.  . [DISCONTINUED] predniSONE (DELTASONE) 10 MG tablet 3 tabs by mouth per day for 3 days,1tabs per day for 3 days,1tab per day for 3 days   No facility-administered encounter medications on file as of 11/30/2016.       Allergies: No Known Allergies  Family History: Family History  Problem Relation Age of Onset  . Healthy Mother   . Cirrhosis Father   . Healthy Sister   . Healthy Brother     Social History: Social History   Tobacco Use  . Smoking status: Never Smoker  . Smokeless tobacco: Never Used  Substance Use Topics  . Alcohol use: No  . Drug use: No   Social History   Social History Narrative   Married.   2 children.     immigrated from [place] 2005   Work: homemaker   Lives in a 2 story home.    Education: high school.      Review of Systems:  CONSTITUTIONAL: No fevers, chills, night sweats, or weight loss.   EYES: No visual changes or eye pain ENT: No hearing changes.  No history of nose bleeds.   RESPIRATORY: No cough, wheezing and shortness of breath.   CARDIOVASCULAR: Negative for chest pain, and palpitations.   GI: Negative for abdominal discomfort, blood in stools or black stools.  No recent change in bowel habits.   GU:  No history of incontinence.   MUSCLOSKELETAL: +history of joint pain or swelling.  No myalgias.   SKIN: Negative for lesions, rash, and itching.   HEMATOLOGY/ONCOLOGY: Negative for prolonged bleeding, bruising easily, and swollen nodes.  No history of cancer.   ENDOCRINE: Negative for cold or heat intolerance, polydipsia or goiter.   PSYCH:  +depression +anxiety symptoms.   NEURO: As Above.   Vital Signs:  BP 108/68   Pulse 79   Ht 5\' 2"  (1.575 m)   Wt 176 lb 4 oz (79.9 kg)   SpO2 98%   BMI 32.24 kg/m    General Medical Exam:   General:  Well appearing, comfortable.   Eyes/ENT: see cranial nerve examination.   Neck: No masses appreciated.  Full range of motion without tenderness.  No carotid bruits. Respiratory:  Clear to auscultation, good air entry bilaterally.   Cardiac:  Regular rate and rhythm, no murmur.   Extremities:  No deformities, edema, or skin discoloration.  Skin:  No rashes or lesions.  Neurological Exam: MENTAL  STATUS including orientation to time, place, person, recent and remote memory, attention span and concentration, language, and fund of knowledge is normal.  Speech is not dysarthric.  CRANIAL NERVES: II:  No visual field defects.  Unremarkable fundi.   III-IV-VI: Pupils equal round and reactive to light.  Normal conjugate, extra-ocular eye movements in all directions of gaze.  No nystagmus.  No ptosis.   V:  Normal facial sensation.  Jaw jerk is absent.   VII:  Normal facial symmetry and movements.  No pathologic facial reflexes.  VIII:  Normal hearing and vestibular function.   IX-X:  Normal palatal movement.   XI:  Normal shoulder shrug and head rotation.   XII:  Normal tongue strength and range of motion, no deviation or fasciculation.  MOTOR:  No atrophy, fasciculations or abnormal movements.  No pronator drift.  Tone is normal.    Right Upper Extremity:    Left Upper Extremity:    Deltoid  5/5   Deltoid  5/5   Biceps  5/5   Biceps  5/5   Triceps  5/5   Triceps  5/5   Wrist extensors  5/5   Wrist extensors  5/5   Wrist flexors  5/5   Wrist flexors  5/5   Finger extensors  5/5   Finger extensors  5/5   Finger flexors  5/5   Finger flexors  5/5   Dorsal interossei  5/5   Dorsal interossei  5/5   Abductor pollicis  5/5   Abductor pollicis  5/5   Tone (Ashworth scale)  0  Tone (Ashworth scale)  0   Right Lower Extremity:    Left Lower Extremity:    Hip flexors  5/5   Hip flexors  5/5   Hip extensors  5/5   Hip extensors  5/5   Knee flexors  5/5   Knee flexors  5/5   Knee extensors  5/5   Knee extensors  5/5   Dorsiflexors  5/5   Dorsiflexors  5/5   Plantarflexors  5/5   Plantarflexors  5/5   Toe extensors  5/5   Toe extensors  5/5   Toe flexors  5/5   Toe flexors  5/5   Tone (Ashworth scale)  0  Tone (Ashworth scale)  0   MSRs:  Right                                                                 Left brachioradialis 2+  brachioradialis 2+  biceps 2+  biceps 2+  triceps 2+   triceps 2+  patellar 2+  patellar 2+  ankle jerk 2+  ankle jerk 2+  Hoffman no  Hoffman no  plantar response down  plantar response down   SENSORY:  Normal and symmetric perception of light touch, pinprick, vibration, and proprioception.  Romberg's sign absent.   COORDINATION/GAIT: Normal finger-to- nose-finger and heel-to-shin.  Intact rapid alternating movements bilaterally.  Able to rise from a chair without using arms.  Gait narrow based and stable. Tandem and stressed gait intact.    IMPRESSION: Mrs. Paternostro is a 36 year-old female referred for evaluation of generalized paresthesias of the body.  Symptoms are migratory and do not confirm to a nerve distribution.  Reassured patient that symptoms are most likely a manifestation of anxiety and stress.  Because of her age and recent pregnancy, she will be evaluated for demyelinating disease with MRI brain wwo contrast, but my overall suspicion is very low.  TSH and vitamin B12 is normal. She declined a referral to see a counselor. All questions were answered and I wished her the best with her personal and family's health.  The duration of this appointment visit was 45 minutes of face-to-face time with the patient.  Greater than 50% of this time was spent in counseling, explanation of diagnosis, planning of further management, and coordination of care.   Thank you for allowing me to participate in patient's care.  If I can answer any additional questions, I would be pleased to do so.  Sincerely,    Keatin Benham K. Posey Pronto, DO

## 2016-11-30 NOTE — Telephone Encounter (Signed)
Pt called in and said that she is going to have a MRI of her brain but she would like for Dr Jonny RuizJohn to put in a order for a MRI for her whole body?  She is worried about all of her nerve pain.

## 2016-11-30 NOTE — Telephone Encounter (Signed)
Very sorry, but this would involve about 20 MRI's with each one more than $2000; I would not feel comfortable with this, and we normally only do MRI for specific reasons, thanks

## 2017-01-01 ENCOUNTER — Telehealth: Payer: Self-pay | Admitting: *Deleted

## 2017-01-01 NOTE — Telephone Encounter (Signed)
Opened in error

## 2017-01-05 ENCOUNTER — Encounter: Payer: Self-pay | Admitting: Neurology

## 2017-01-11 ENCOUNTER — Telehealth: Payer: Self-pay | Admitting: Neurology

## 2017-01-11 ENCOUNTER — Encounter: Payer: Self-pay | Admitting: *Deleted

## 2017-01-11 NOTE — Telephone Encounter (Signed)
Results sent via My Chart.  

## 2017-01-11 NOTE — Telephone Encounter (Signed)
MRI brain with and without contrast 01/05/2017 performed at [facility]: No significant abnormalities.  Regina Fox, please inform patient that MRI brain is normal and does not show anything worrisome (stroke, MS, etc).  Donika K. Allena KatzPatel, DO

## 2017-01-22 ENCOUNTER — Ambulatory Visit: Payer: BC Managed Care – PPO | Admitting: Neurology

## 2017-01-28 ENCOUNTER — Other Ambulatory Visit: Payer: Self-pay | Admitting: Family Medicine

## 2017-02-08 ENCOUNTER — Encounter: Payer: Self-pay | Admitting: Internal Medicine

## 2017-02-08 ENCOUNTER — Ambulatory Visit: Payer: BC Managed Care – PPO | Admitting: Internal Medicine

## 2017-02-08 VITALS — BP 116/78 | HR 95 | Temp 98.1°F | Ht 62.0 in | Wt 179.0 lb

## 2017-02-08 DIAGNOSIS — J019 Acute sinusitis, unspecified: Secondary | ICD-10-CM

## 2017-02-08 DIAGNOSIS — F419 Anxiety disorder, unspecified: Secondary | ICD-10-CM

## 2017-02-08 DIAGNOSIS — J309 Allergic rhinitis, unspecified: Secondary | ICD-10-CM | POA: Diagnosis not present

## 2017-02-08 MED ORDER — AZITHROMYCIN 250 MG PO TABS: ORAL_TABLET | ORAL | 1 refills | Status: AC

## 2017-02-08 MED ORDER — PREDNISONE 10 MG PO TABS: ORAL_TABLET | ORAL | 0 refills | Status: AC

## 2017-02-08 NOTE — Patient Instructions (Addendum)
Please take all new medication as prescribed - the antibiotic, and the prednisone  You can also take Delsym OTC for cough, and/or Mucinex (or it's generic off brand) for congestion, and tylenol as needed for pain.  Please continue all other medications as before, and refills have been done if requested.  Please have the pharmacy call with any other refills you may need.  Please continue your efforts at being more active, low cholesterol diet, and weight control.  Please keep your appointments with your specialists as you may have planned

## 2017-02-11 DIAGNOSIS — F419 Anxiety disorder, unspecified: Secondary | ICD-10-CM | POA: Insufficient documentation

## 2017-02-11 NOTE — Assessment & Plan Note (Signed)
Mild to mod, for antibx course,  to f/u any worsening symptoms or concerns 

## 2017-02-11 NOTE — Assessment & Plan Note (Signed)
Improved overall but still mild to mod situational, declines counseling or other tx

## 2017-02-11 NOTE — Assessment & Plan Note (Addendum)
For OTC zyrtec and nasacort asd, predpac asd,  to f/u any worsening symptoms or concerns

## 2017-02-11 NOTE — Progress Notes (Signed)
Subjective:    Patient ID: Regina Fox, female    DOB: 05-22-1980, 37 y.o.   MRN: [id number]  HPI   Here with 2-3 days acute onset fever, facial pain, pressure, headache, general weakness and malaise, and greenish d/c, with mild ST and cough, but pt denies chest pain, wheezing, increased sob or doe, orthopnea, PND, increased LE swelling, palpitations, dizziness or syncope.  Does have several wks ongoing nasal allergy symptoms with clearish congestion, itch and sneezing, without fever, pain, ST, cough, swelling or wheezing.  Denies worsening depressive symptoms, suicidal ideation, or panic; has ongoing anxiety, not increased recently, in fact better, but still with significant stress due to new infant with congenital heart dz related issue that will require surgury Past Medical History:  Diagnosis Date  . Allergic rhinitis, cause unspecified 07/18/2010  . RASH-NONVESICULAR 10/01/2009  . URI 08/22/2007  . Vitamin D deficiency 02/15/2012   Past Surgical History:  Procedure Laterality Date  . Denied Surgical Hx    . ECTOPIC PREGNANCY SURGERY    . LAPAROSCOPY N/A 06/14/2012   Procedure: LAPAROSCOPY OPERATIVE;  Surgeon: Freddrick March. Tenny Craw, MD;  Location: [facility];  Service: Gynecology;  Laterality: N/A;    reports that  has never smoked. she has never used smokeless tobacco. She reports that she does not drink alcohol or use drugs. family history includes Cirrhosis in her father; Healthy in her brother, mother, and sister. No Known Allergies Current Outpatient Medications on File Prior to Visit  Medication Sig Dispense Refill  . calcium carbonate (TUMS - DOSED IN MG ELEMENTAL CALCIUM) 500 MG chewable tablet Chew 1 tablet by mouth daily.    . Cephalexin (KEFLEX PO) Take by mouth.    . cetirizine (ZYRTEC) 10 MG tablet Take 1 tablet (10 mg total) by mouth daily. 30 tablet 11  . cyclobenzaprine (FLEXERIL) 5 MG tablet Take 1 tablet (5 mg total) by mouth 3 (three) times daily as needed for muscle spasms. 30  tablet 1  . pantoprazole (PROTONIX) 40 MG tablet Take 1 tablet (40 mg total) by mouth daily. 90 tablet 3  . potassium chloride (K-DUR) 10 MEQ tablet Take 1 tablet (10 mEq total) by mouth daily. 7 tablet 0  . Prenatal Vit-Fe Fumarate-FA (PRENATAL MULTIVITAMIN) TABS Take 1 tablet by mouth daily at 12 noon.    . Vitamin D, Ergocalciferol, (DRISDOL) 50000 units CAPS capsule TAKE 1 CAPSULE BY MOUTH EVERY 7 DAYS 12 capsule 0   No current facility-administered medications on file prior to visit.    Review of Systems  Constitutional: Negative for other unusual diaphoresis or sweats HENT: Negative for ear discharge or swelling Eyes: Negative for other worsening visual disturbances Respiratory: Negative for stridor or other swelling  Gastrointestinal: Negative for worsening distension or other blood Genitourinary: Negative for retention or other urinary change Musculoskeletal: Negative for other MSK pain or swelling Skin: Negative for color change or other new lesions Neurological: Negative for worsening tremors and other numbness  Psychiatric/Behavioral: Negative for worsening agitation or other fatigue All other system neg per pt    Objective:   Physical Exam BP 116/78   Pulse 95   Temp 98.1 F (36.7 C) (Oral)   Ht 5\' 2"  (1.575 m)   Wt 179 lb (81.2 kg)   SpO2 96%   BMI 32.74 kg/m  VS noted, mild ill Constitutional: Pt appears in NAD HENT: Head: NCAT.  Right Ear: External ear normal.  Left Ear: External ear normal.  Eyes: . Pupils are equal, round, and  reactive to light. Conjunctivae and EOM are normal Bilat tm's with mild erythema.  Max sinus areas mild tender.  Pharynx with mild erythema, no exudate Nose: without d/c or deformity Neck: Neck supple. Gross normal ROM Cardiovascular: Normal rate and regular rhythm.   Pulmonary/Chest: Effort normal and breath sounds without rales or wheezing.  Neurological: Pt is alert. At baseline orientation, motor grossly intact Skin: Skin is  warm. No rashes, other new lesions, no LE edema Psychiatric: Pt behavior is normal without agitation , 1+ nervous No other exam findings    Assessment & Plan:

## 2017-06-23 ENCOUNTER — Other Ambulatory Visit: Payer: Self-pay | Admitting: Family Medicine

## 2017-06-25 NOTE — Telephone Encounter (Signed)
Refill denied. Pt has completed course of treatment.  

## 2017-07-03 ENCOUNTER — Other Ambulatory Visit: Payer: Self-pay | Admitting: Family Medicine

## 2017-08-16 ENCOUNTER — Telehealth: Payer: Self-pay | Admitting: Internal Medicine

## 2017-08-16 DIAGNOSIS — Z0000 Encounter for general adult medical examination without abnormal findings: Secondary | ICD-10-CM

## 2017-08-16 NOTE — Telephone Encounter (Signed)
Patent is scheduled for a physical on 10/01/17. She would like to have labs done prior to this visit. Can orders be entered? I will notify patient when they are ready. Please advise.

## 2017-09-23 ENCOUNTER — Encounter: Payer: Self-pay | Admitting: Internal Medicine

## 2017-09-23 DIAGNOSIS — Z0000 Encounter for general adult medical examination without abnormal findings: Secondary | ICD-10-CM

## 2017-09-23 DIAGNOSIS — E559 Vitamin D deficiency, unspecified: Secondary | ICD-10-CM

## 2017-09-24 NOTE — Telephone Encounter (Signed)
Blood work is entered. Okay to add Vit D?

## 2017-09-28 ENCOUNTER — Other Ambulatory Visit (INDEPENDENT_AMBULATORY_CARE_PROVIDER_SITE_OTHER): Payer: BC Managed Care – PPO

## 2017-09-28 DIAGNOSIS — Z0000 Encounter for general adult medical examination without abnormal findings: Secondary | ICD-10-CM | POA: Diagnosis not present

## 2017-09-28 DIAGNOSIS — E559 Vitamin D deficiency, unspecified: Secondary | ICD-10-CM

## 2017-09-28 LAB — VITAMIN D 25 HYDROXY (VIT D DEFICIENCY, FRACTURES): VITD: 23 ng/mL — AB (ref 30.00–100.00)

## 2017-09-28 LAB — CBC WITH DIFFERENTIAL/PLATELET
BASOS PCT: 0.8 % (ref 0.0–3.0)
Basophils Absolute: 0 10*3/uL (ref 0.0–0.1)
EOS PCT: 1 % (ref 0.0–5.0)
Eosinophils Absolute: 0.1 10*3/uL (ref 0.0–0.7)
HEMATOCRIT: 39.7 % (ref 36.0–46.0)
Hemoglobin: 13.5 g/dL (ref 12.0–15.0)
LYMPHS PCT: 32.3 % (ref 12.0–46.0)
Lymphs Abs: 1.8 10*3/uL (ref 0.7–4.0)
MCHC: 33.9 g/dL (ref 30.0–36.0)
MCV: 87.4 fl (ref 78.0–100.0)
MONO ABS: 0.5 10*3/uL (ref 0.1–1.0)
MONOS PCT: 8.4 % (ref 3.0–12.0)
Neutro Abs: 3.2 10*3/uL (ref 1.4–7.7)
Neutrophils Relative %: 57.5 % (ref 43.0–77.0)
Platelets: 214 10*3/uL (ref 150.0–400.0)
RBC: 4.54 Mil/uL (ref 3.87–5.11)
RDW: 12.3 % (ref 11.5–15.5)
WBC: 5.5 10*3/uL (ref 4.0–10.5)

## 2017-09-28 LAB — HEPATIC FUNCTION PANEL
ALBUMIN: 4.4 g/dL (ref 3.5–5.2)
ALT: 11 U/L (ref 0–35)
AST: 14 U/L (ref 0–37)
Alkaline Phosphatase: 56 U/L (ref 39–117)
Bilirubin, Direct: 0.1 mg/dL (ref 0.0–0.3)
TOTAL PROTEIN: 7.4 g/dL (ref 6.0–8.3)
Total Bilirubin: 0.6 mg/dL (ref 0.2–1.2)

## 2017-09-28 LAB — BASIC METABOLIC PANEL
BUN: 13 mg/dL (ref 6–23)
CALCIUM: 9.5 mg/dL (ref 8.4–10.5)
CO2: 25 mEq/L (ref 19–32)
Chloride: 106 mEq/L (ref 96–112)
Creatinine, Ser: 0.73 mg/dL (ref 0.40–1.20)
GFR: 96.36 mL/min (ref >60.00)
Glucose, Bld: 88 mg/dL (ref 70–99)
POTASSIUM: 3.7 meq/L (ref 3.5–5.1)
SODIUM: 140 meq/L (ref 135–145)

## 2017-09-28 LAB — URINALYSIS, ROUTINE W REFLEX MICROSCOPIC
Bilirubin Urine: NEGATIVE
Hgb urine dipstick: NEGATIVE
KETONES UR: NEGATIVE
LEUKOCYTES UA: NEGATIVE
Nitrite: NEGATIVE
PH: 5.5 (ref 5.0–8.0)
Total Protein, Urine: NEGATIVE
UROBILINOGEN UA: 0.2 (ref 0.0–1.0)
Urine Glucose: NEGATIVE

## 2017-09-28 LAB — TSH: TSH: 1.18 u[IU]/mL (ref 0.35–4.50)

## 2017-09-28 LAB — LIPID PANEL
CHOLESTEROL: 160 mg/dL (ref 0–200)
HDL: 48.7 mg/dL (ref >39.00)
LDL Cholesterol: 102 mg/dL — ABNORMAL HIGH (ref 0–99)
NonHDL: 111.5
Total CHOL/HDL Ratio: 3
Triglycerides: 47 mg/dL (ref 0.0–149.0)
VLDL: 9.4 mg/dL (ref 0.0–40.0)

## 2017-10-01 ENCOUNTER — Encounter: Payer: Self-pay | Admitting: Internal Medicine

## 2017-10-01 ENCOUNTER — Ambulatory Visit (INDEPENDENT_AMBULATORY_CARE_PROVIDER_SITE_OTHER): Payer: BC Managed Care – PPO | Admitting: Internal Medicine

## 2017-10-01 VITALS — BP 126/80 | HR 87 | Temp 98.0°F | Ht 62.0 in | Wt 169.0 lb

## 2017-10-01 DIAGNOSIS — E559 Vitamin D deficiency, unspecified: Secondary | ICD-10-CM | POA: Diagnosis not present

## 2017-10-01 DIAGNOSIS — K219 Gastro-esophageal reflux disease without esophagitis: Secondary | ICD-10-CM | POA: Diagnosis not present

## 2017-10-01 DIAGNOSIS — Z0000 Encounter for general adult medical examination without abnormal findings: Secondary | ICD-10-CM

## 2017-10-01 MED ORDER — VITAMIN D (ERGOCALCIFEROL) 1.25 MG (50000 UNIT) PO CAPS: ORAL_CAPSULE | ORAL | 0 refills | Status: DC

## 2017-10-01 NOTE — Assessment & Plan Note (Signed)
Stable but suspect mild gastritis; to d/c advil, for tylenol prn

## 2017-10-01 NOTE — Patient Instructions (Signed)
Please take all new medication as prescribed - the Vitamin D 50,000 units per week for 12 weeks  Then Change the Vitamin D3 to 2000 units per day (OTC)  Please hold on taking the ibuprofen  You can try TUMS which is safe for the stomach, but if not enough, you can try OTC Nexium - 1 or 2 per day  Please continue all other medications as before, and refills have been done if requested.  Please have the pharmacy call with any other refills you may need.  Please continue your efforts at being more active, low cholesterol diet, and weight control.  You are otherwise up to date with prevention measures today.  Please keep your appointments with your specialists as you may have planned  Please return in 6 months, or sooner if needed

## 2017-10-01 NOTE — Progress Notes (Signed)
Subjective:    Patient ID: Regina Fox Theilen, female    DOB: 05/18/1980, 37 y.o.   MRN: [id number]  HPI  Here for wellness and f/u;  Exam somewhat limited by the attendance of her youngest child who unfortuantely will not stop screaming, maybe b/c she has learned to be fearful of medical environment per pt after the childs recent heart surgury; Overall doing ok;  Pt denies Chest pain, worsening SOB, DOE, wheezing, orthopnea, PND, worsening LE edema, palpitations, dizziness or syncope.  Pt denies neurological change such as new headache, facial or extremity weakness.  Pt denies polydipsia, polyuria, or low sugar symptoms. Pt states overall good compliance with treatment and medications, good tolerability, and has been trying to follow appropriate diet.  Pt denies worsening depressive symptoms, suicidal ideation or panic. No fever, night sweats, wt loss, loss of appetite, or other constitutional symptoms.  Pt states good ability with ADL's, has low fall risk, home safety reviewed and adequate, no other significant changes in hearing or vision, and only occasionally active with exercise. Declines flu shot.  Denies worsening reflux, but has had mild abd upper discomfort intermittent without dysphagia, n/v, bowel change or blood.  Taking advil daily for several wks due to general discomfort.  No new complaints Past Medical History:  Diagnosis Date  . Allergic rhinitis, cause unspecified 07/18/2010  . RASH-NONVESICULAR 10/01/2009  . URI 08/22/2007  . Vitamin D deficiency 02/15/2012   Past Surgical History:  Procedure Laterality Date  . Denied Surgical Hx    . ECTOPIC PREGNANCY SURGERY    . LAPAROSCOPY N/A 06/14/2012   Procedure: LAPAROSCOPY OPERATIVE;  Surgeon: Freddrick MarchKendra H. Tenny Crawoss, MD;  Location: [facility];  Service: Gynecology;  Laterality: N/A;    reports that she has never smoked. She has never used smokeless tobacco. She reports that she does not drink alcohol or use drugs. family history includes Cirrhosis in her  father; Healthy in her brother, mother, and sister. No Known Allergies Current Outpatient Medications on File Prior to Visit  Medication Sig Dispense Refill  . azithromycin (ZITHROMAX Z-PAK) 250 MG tablet 2 tab by mouth day 1, then 1 per day 6 tablet 1  . calcium carbonate (TUMS - DOSED IN MG ELEMENTAL CALCIUM) 500 MG chewable tablet Chew 1 tablet by mouth daily.    . Cephalexin (KEFLEX PO) Take by mouth.    . cyclobenzaprine (FLEXERIL) 5 MG tablet Take 1 tablet (5 mg total) by mouth 3 (three) times daily as needed for muscle spasms. 30 tablet 1  . pantoprazole (PROTONIX) 40 MG tablet Take 1 tablet (40 mg total) by mouth daily. 90 tablet 3  . potassium chloride (K-DUR) 10 MEQ tablet Take 1 tablet (10 mEq total) by mouth daily. 7 tablet 0  . predniSONE (DELTASONE) 10 MG tablet 2 tabs by mouth per day for 5 days 10 tablet 0  . Prenatal Vit-Fe Fumarate-FA (PRENATAL MULTIVITAMIN) TABS Take 1 tablet by mouth daily at 12 noon.    . cetirizine (ZYRTEC) 10 MG tablet Take 1 tablet (10 mg total) by mouth daily. 30 tablet 11   No current facility-administered medications on file prior to visit.    Review of Systems Constitutional: Negative for other unusual diaphoresis, sweats, appetite or weight changes HENT: Negative for other worsening hearing loss, ear pain, facial swelling, mouth sores or neck stiffness.   Eyes: Negative for other worsening pain, redness or other visual disturbance.  Respiratory: Negative for other stridor or swelling Cardiovascular: Negative for other palpitations or other chest  pain  Gastrointestinal: Negative for worsening diarrhea or loose stools, blood in stool, distention or other pain Genitourinary: Negative for hematuria, flank pain or other change in urine volume.  Musculoskeletal: Negative for myalgias or other joint swelling.  Skin: Negative for other color change, or other wound or worsening drainage.  Neurological: Negative for other syncope or  numbness. Hematological: Negative for other adenopathy or swelling Psychiatric/Behavioral: Negative for hallucinations, other worsening agitation, SI, self-injury, or new decreased concentration All other system neg per pt    Objective:   Physical Exam BP 126/80   Pulse 87   Temp 98 F (36.7 C) (Oral)   Ht 5\' 2"  (1.575 m)   Wt 169 lb (76.7 kg)   SpO2 97%   BMI 30.91 kg/m  VS noted,  Constitutional: Pt is oriented to person, place, and time. Appears well-developed and well-nourished, in no significant distress and comfortable Head: Normocephalic and atraumatic  Eyes: Conjunctivae and EOM are normal. Pupils are equal, round, and reactive to light Right Ear: External ear normal without discharge Left Ear: External ear normal without discharge Nose: Nose without discharge or deformity Mouth/Throat: Oropharynx is without other ulcerations and moist  Neck: Normal range of motion. Neck supple. No JVD present. No tracheal deviation present or significant neck LA or mass Cardiovascular: Normal rate, regular rhythm, normal heart sounds and intact distal pulses.   Pulmonary/Chest: WOB normal and breath sounds without rales or wheezing  Abdominal: Soft. Bowel sounds are normal. NT. No HSM  Musculoskeletal: Normal range of motion. Exhibits no edema Lymphadenopathy: Has no other cervical adenopathy.  Neurological: Pt is alert and oriented to person, place, and time. Pt has normal reflexes. No cranial nerve deficit. Motor grossly intact, Gait intact Skin: Skin is warm and dry. No rash noted or new ulcerations Psychiatric:  Has normal mood and affect. Behavior is normal without agitation No other exam findings Lab Results  Component Value Date   WBC 5.5 09/28/2017   HGB 13.5 09/28/2017   HCT 39.7 09/28/2017   PLT 214.0 09/28/2017   GLUCOSE 88 09/28/2017   CHOL 160 09/28/2017   TRIG 47.0 09/28/2017   HDL 48.70 09/28/2017   LDLCALC 102 (H) 09/28/2017   ALT 11 09/28/2017   AST 14  09/28/2017   NA 140 09/28/2017   K 3.7 09/28/2017   CL 106 09/28/2017   CREATININE 0.73 09/28/2017   BUN 13 09/28/2017   CO2 25 09/28/2017   TSH 1.18 09/28/2017       Assessment & Plan:

## 2017-10-01 NOTE — Assessment & Plan Note (Signed)

## 2017-10-01 NOTE — Assessment & Plan Note (Signed)
For replacement 

## 2018-02-20 ENCOUNTER — Other Ambulatory Visit: Payer: Self-pay | Admitting: Internal Medicine

## 2018-04-03 ENCOUNTER — Ambulatory Visit: Payer: BC Managed Care – PPO | Admitting: Internal Medicine

## 2018-08-25 IMAGING — CR DG CHEST 2V
2 series · 2 of 2 positions shown · non-contrast
Comparison: Two-view chest x-ray 09/26/2016.

CLINICAL DATA: Chest pain. Left arm numbness. Three months
postpartum

EXAM:
CHEST  2 VIEW

[w chest pa]
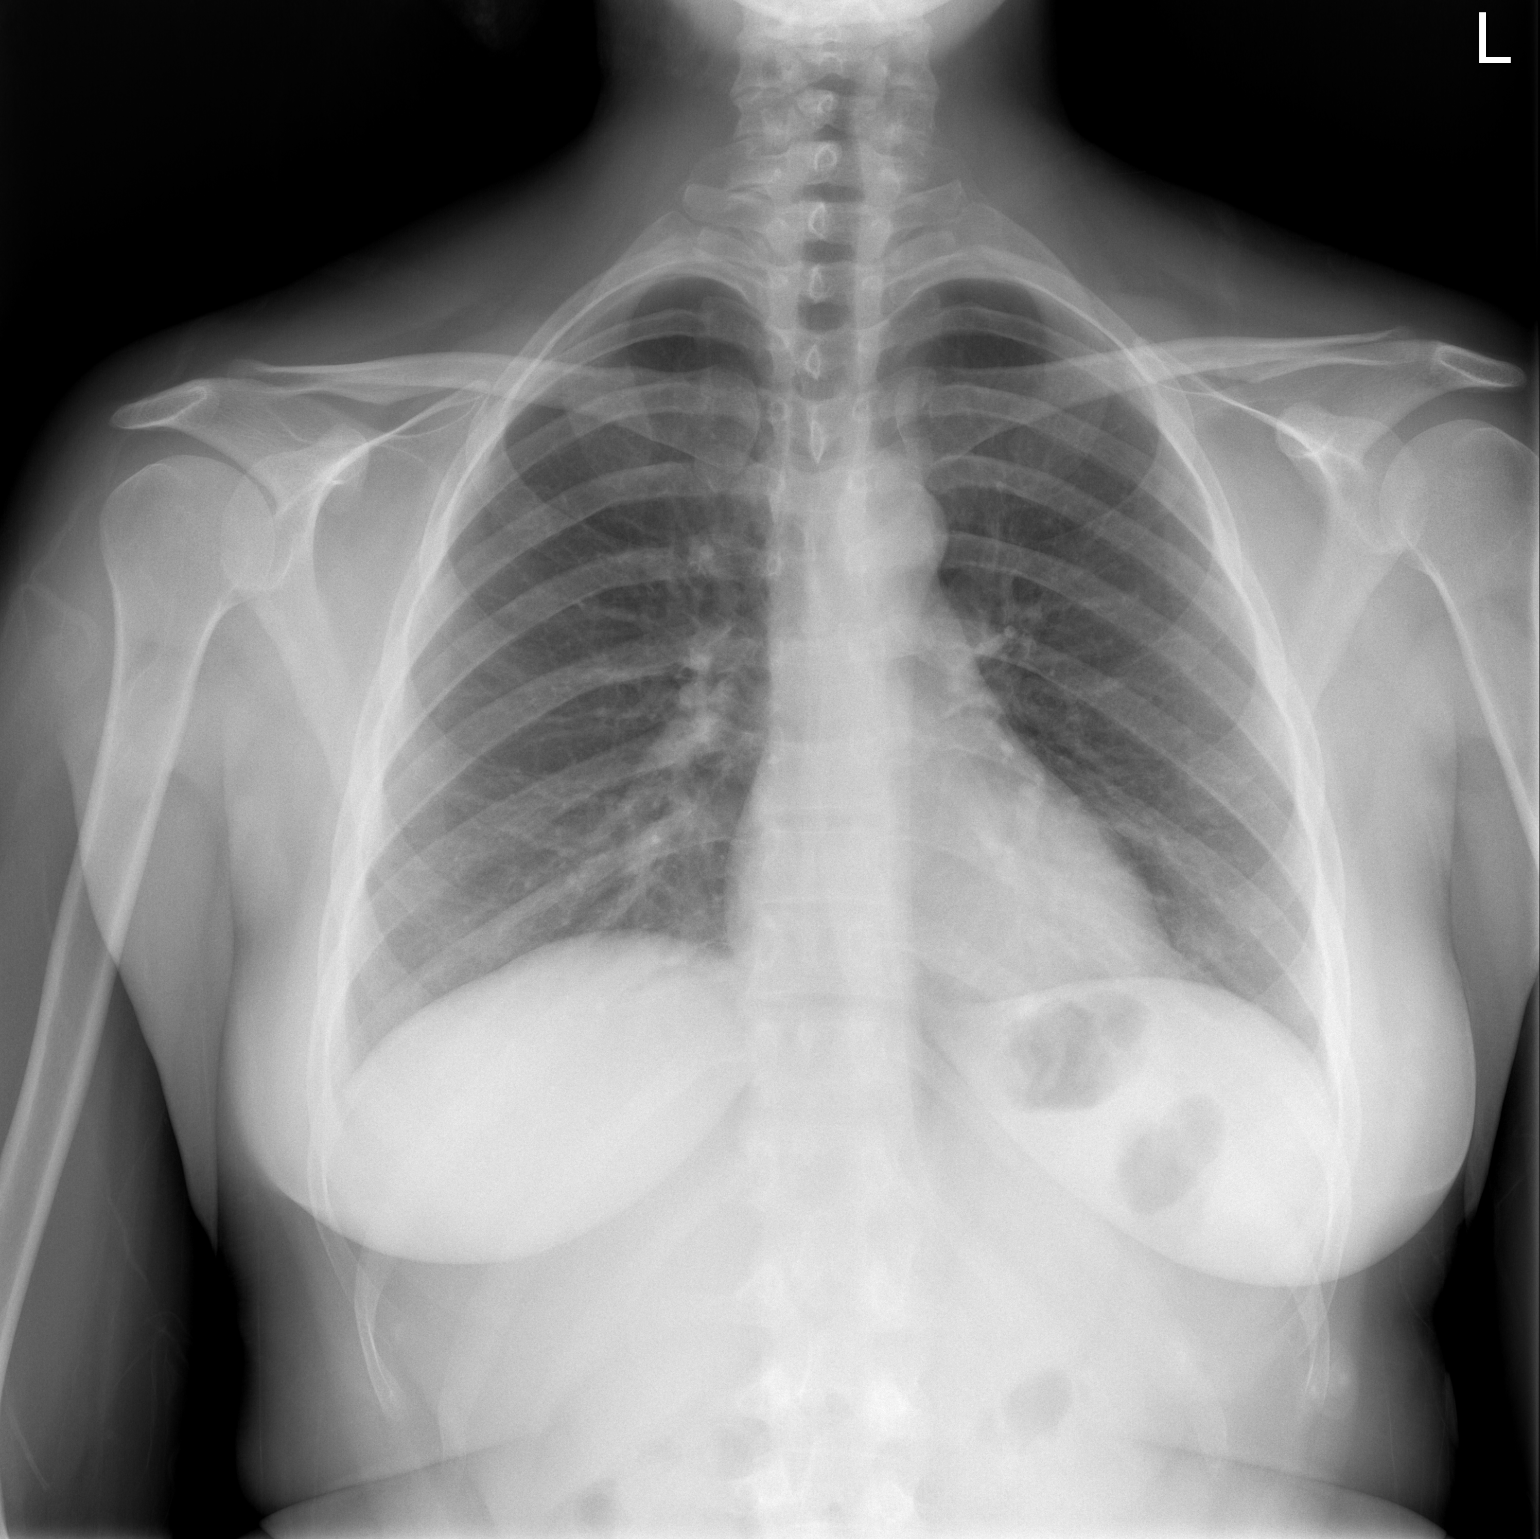

[w chest lat]
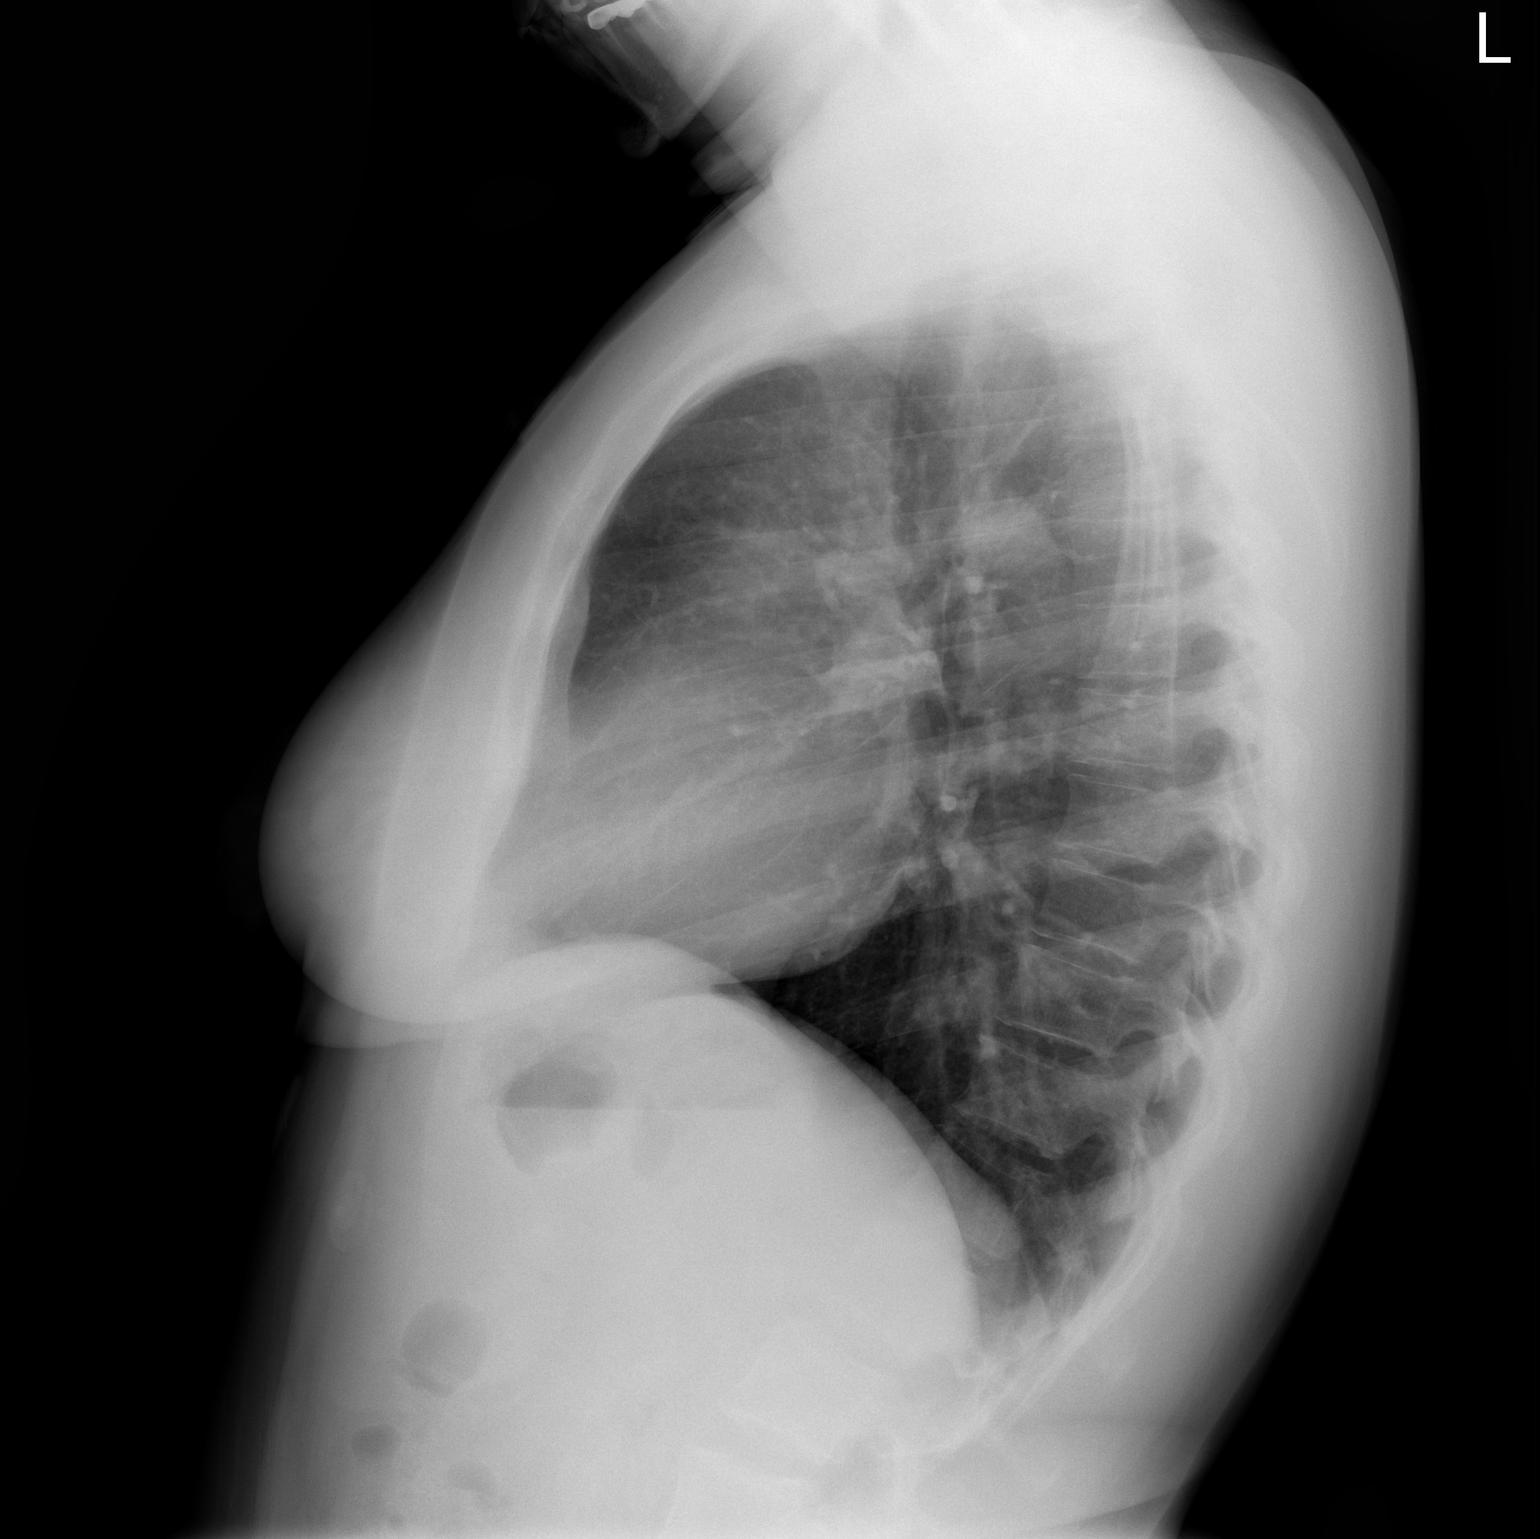

[2 of 2 positions shown; findings below may reference images not displayed]

FINDINGS: The heart size and mediastinal contours are within normal limits.
Both lungs are clear. The visualized skeletal structures are
unremarkable.
IMPRESSION: Negative two view chest x-ray
